# Patient Record
Sex: Female | Born: 1970 | Race: White | Hispanic: No | Marital: Married | Smoking: Current every day smoker
Health system: Southern US, Community
[De-identification: ages and names within clinical notes are randomized; demographics above are authoritative.]

## PROBLEM LIST (undated history)

## (undated) DIAGNOSIS — M797 Fibromyalgia: Secondary | ICD-10-CM

## (undated) DIAGNOSIS — F419 Anxiety disorder, unspecified: Secondary | ICD-10-CM

## (undated) DIAGNOSIS — M1990 Unspecified osteoarthritis, unspecified site: Secondary | ICD-10-CM

## (undated) HISTORY — PX: KNEE SURGERY: SHX244

## (undated) HISTORY — PX: ABLATION: SHX5711

## (undated) HISTORY — PX: TUBAL LIGATION: SHX77

## (undated) HISTORY — PX: CHOLECYSTECTOMY: SHX55

---

## 2015-07-12 ENCOUNTER — Ambulatory Visit
Admission: EM | Admit: 2015-07-12 | Discharge: 2015-07-12 | Disposition: A | Discharge status: Home / Self Care | Payer: BLUE CROSS/BLUE SHIELD | Attending: Family Medicine | Admitting: Family Medicine

## 2015-07-12 ENCOUNTER — Encounter: Payer: Self-pay | Admitting: Emergency Medicine

## 2015-07-12 DIAGNOSIS — S61215A Laceration without foreign body of left ring finger without damage to nail, initial encounter: Secondary | ICD-10-CM | POA: Diagnosis not present

## 2015-07-12 DIAGNOSIS — S61213A Laceration without foreign body of left middle finger without damage to nail, initial encounter: Secondary | ICD-10-CM

## 2015-07-12 HISTORY — DX: Unspecified osteoarthritis, unspecified site: M19.90

## 2015-07-12 HISTORY — DX: Fibromyalgia: M79.7

## 2015-07-12 HISTORY — DX: Anxiety disorder, unspecified: F41.9

## 2015-07-12 MED ORDER — BACITRACIN ZINC 500 UNIT/GM EX OINT
TOPICAL_OINTMENT | Freq: Once | CUTANEOUS | Status: AC
  Administered 2015-07-12: 18:00:00 via TOPICAL

## 2015-07-12 MED ORDER — LIDOCAINE HCL (PF) 1 % IJ SOLN: 5.0000 mL | Freq: Once | INTRAMUSCULAR | Status: DC

## 2015-07-12 MED ORDER — TETANUS-DIPHTH-ACELL PERTUSSIS 5-2.5-18.5 LF-MCG/0.5 IM SUSP
0.5000 mL | Freq: Once | INTRAMUSCULAR | Status: AC
  Administered 2015-07-12: 0.5 mL via INTRAMUSCULAR

## 2015-07-12 NOTE — Discharge Instructions (Signed)
Keep clean and dry. Wear splint as discussed. Return in 7- 10 days for suture removal. Return sooner for redness, drainage, swelling or wound concerns.   Follow up with your primary care physician this week as needed. Return to Urgent care for new or worsening concerns.    Laceration Care, Adult A laceration is a cut that goes through all layers of the skin. The cut also goes into the tissue that is right under the skin. Some cuts heal on their own. Others need to be closed with stitches (sutures), staples, skin adhesive strips, or wound glue. Taking care of your cut lowers your risk of infection and helps your cut to heal better. HOW TO TAKE CARE OF YOUR CUT For stitches or staples:  Keep the wound clean and dry.  If you were given a bandage (dressing), you should change it at least one time per day or as told by your doctor. You should also change it if it gets wet or dirty.  Keep the wound completely dry for the first 24 hours or as told by your doctor. After that time, you may take a shower or a bath. However, make sure that the wound is not soaked in water until after the stitches or staples have been removed.  Clean the wound one time each day or as told by your doctor:  Wash the wound with soap and water.  Rinse the wound with water until all of the soap comes off.  Pat the wound dry with a clean towel. Do not rub the wound.  After you clean the wound, put a thin layer of antibiotic ointment on it as told by your doctor. This ointment:  Helps to prevent infection.  Keeps the bandage from sticking to the wound.  Have your stitches or staples removed as told by your doctor. If your doctor used skin adhesive strips:   Keep the wound clean and dry.  If you were given a bandage, you should change it at least one time per day or as told by your doctor. You should also change it if it gets dirty or wet.  Do not get the skin adhesive strips wet. You can take a shower or a bath,  but be careful to keep the wound dry.  If the wound gets wet, pat it dry with a clean towel. Do not rub the wound.  Skin adhesive strips fall off on their own. You can trim the strips as the wound heals. Do not remove any strips that are still stuck to the wound. They will fall off after a while. If your doctor used wound glue:  Try to keep your wound dry, but you may briefly wet it in the shower or bath. Do not soak the wound in water, such as by swimming.  After you take a shower or a bath, gently pat the wound dry with a clean towel. Do not rub the wound.  Do not do any activities that will make you really sweaty until the skin glue has fallen off on its own.  Do not apply liquid, cream, or ointment medicine to your wound while the skin glue is still on.  If you were given a bandage, you should change it at least one time per day or as told by your doctor. You should also change it if it gets dirty or wet.  If a bandage is placed over the wound, do not let the tape for the bandage touch the skin glue.  Do not pick at the glue. The skin glue usually stays on for 5-10 days. Then, it falls off of the skin. General Instructions  To help prevent scarring, make sure to cover your wound with sunscreen whenever you are outside after stitches are removed, after adhesive strips are removed, or when wound glue stays in place and the wound is healed. Make sure to wear a sunscreen of at least 30 SPF.  Take over-the-counter and prescription medicines only as told by your doctor.  If you were given antibiotic medicine or ointment, take or apply it as told by your doctor. Do not stop using the antibiotic even if your wound is getting better.  Do not scratch or pick at the wound.  Keep all follow-up visits as told by your doctor. This is important.  Check your wound every day for signs of infection. Watch for:  Redness, swelling, or pain.  Fluid, blood, or pus.  Raise (elevate) the injured  area above the level of your heart while you are sitting or lying down, if possible. GET HELP IF:  You got a tetanus shot and you have any of these problems at the injection site:  Swelling.  Very bad pain.  Redness.  Bleeding.  You have a fever.  A wound that was closed breaks open.  You notice a bad smell coming from your wound or your bandage.  You notice something coming out of the wound, such as wood or glass.  Medicine does not help your pain.  You have more redness, swelling, or pain at the site of your wound.  You have fluid, blood, or pus coming from your wound.  You notice a change in the color of your skin near your wound.  You need to change the bandage often because fluid, blood, or pus is coming from the wound.  You start to have a new rash.  You start to have numbness around the wound. GET HELP RIGHT AWAY IF:  You have very bad swelling around the wound.  Your pain suddenly gets worse and is very bad.  You notice painful lumps near the wound or on skin that is anywhere on your body.  You have a red streak going away from your wound.  The wound is on your hand or foot and you cannot move a finger or toe like you usually can.  The wound is on your hand or foot and you notice that your fingers or toes look pale or bluish.   This information is not intended to replace advice given to you by your health care provider. Make sure you discuss any questions you have with your health care provider.   Document Released: 07/21/2007 Document Revised: 06/18/2014 Document Reviewed: 01/28/2014 Elsevier Interactive Patient Education [employer]

## 2015-07-12 NOTE — ED Notes (Signed)
Patient c/o laceration on left middle and left  ring finger while rubbing her hand across her couch. Per patient injury hand/ finger by a nail in the material of her couch x today.

## 2015-07-12 NOTE — ED Provider Notes (Addendum)
[facility]  ____________________________________________  Time seen: Approximately 1700 PM  I have reviewed the triage vital signs and the nursing notes.   HISTORY  Chief Complaint Laceration   HPI Sandra Alexander is a 45 y.o. female presents with a complaint of laceration to left hand. Patient reports that just prior to arrival she accidentally cut her left hand on a nail sticking out of a couch. Patient reports that she recently got this couch and is restoring it. Patient states that she was sliding her hand across the top of the couch and accidentally slit her hand over top of the nail slightly causing a laceration. Denies any blunt crush injury or blunt trauma. States minimal pain directly at laceration site only. Reports immediately irrigated at home. Unsure of last tetanus immunization. Reports right hand dominant. Denies any numbness or tingling sensation. Denies any pain radiation. Denies any other pain, injury or discomfort. Denies fevers or recent sickness.   Past Medical History  Diagnosis Date  . Fibromyalgia   . Arthritis   . Anxiety     There are no active problems to display for this patient.   Past Surgical History  Procedure Laterality Date  . Cholecystectomy    . Tubal ligation    . Ablation    . Knee surgery Right     Current Outpatient Rx  Name  Route  Sig  Dispense  Refill  . busPIRone (BUSPAR) 7.5 MG tablet   Oral   Take 7.5 mg by mouth 3 (three) times daily.         . citalopram (CELEXA) 40 MG tablet   Oral   Take 40 mg by mouth daily.         Marland Kitchen. DICLOFENAC PO   Oral   Take by mouth.         . fluticasone (FLONASE) 50 MCG/ACT nasal spray   Each Nare   Place 2 sprays into both nostrils daily.         Marland Kitchen. gabapentin (NEURONTIN) 300 MG capsule   Oral   Take 300 mg by mouth 4 (four) times daily.         . pramipexole (MIRAPEX) 0.125 MG tablet   Oral   Take 0.125 mg by mouth as directed.           Allergies Doxycycline  and Sulfa antibiotics  History reviewed. No pertinent family history.  Social History Social History  Substance Use Topics  . Smoking status: Current Every Day Smoker -- 0.50 packs/day  . Smokeless tobacco: None  . Alcohol Use: Yes    Review of Systems Constitutional: No fever/chills Eyes: No visual changes. ENT: No sore throat. Cardiovascular: Denies chest pain. Respiratory: Denies shortness of breath. Gastrointestinal: No abdominal pain.  No nausea, no vomiting.  No diarrhea.  No constipation. Genitourinary: Negative for dysuria. Musculoskeletal: Negative for back pain. Skin: Negative for rash.As above.  Neurological: Negative for headaches, focal weakness or numbness.  10-point ROS otherwise negative.  ____________________________________________   PHYSICAL EXAM:  VITAL SIGNS: ED Triage Vitals  Enc Vitals Group     BP 07/12/15 1624 122/78 mmHg     Pulse Rate 07/12/15 1624 72     Resp -- 18     Temp 07/12/15 1624 98.4 F (36.9 C)     Temp Source 07/12/15 1624 Oral     SpO2 07/12/15 1624 99 %     Weight 07/12/15 1624 140 lb (63.504 kg)     Height 07/12/15 1624 5' (  1.524 m)     Head Cir --      Peak Flow --      Pain Score 07/12/15 1624 2     Pain Loc --      Pain Edu? --      Excl. in GC? --     Constitutional: Alert and oriented. Well appearing and in no acute distress. Eyes: Conjunctivae are normal. PERRL. EOMI. Head: Atraumatic. Neck: No stridor.  No cervical spine tenderness to palpation. Cardiovascular: Normal rate, regular rhythm. Grossly normal heart sounds.  Good peripheral circulation. Respiratory: Normal respiratory effort.  No retractions. Lungs CTAB. Musculoskeletal: No lower or upper extremity tenderness nor edema.  Bilateral hand grip strong and equal. Bilateral distal radial pulses equal and easily palpated. Sensation intact to left hand. No motor or tendon deficits to left hand. Neurologic:  Normal speech and language. No gross focal  neurologic deficits are appreciated. No gait instability. Skin:  Skin is warm, dry and intact. No rash noted. Except: Left hand volar surface at base of third digit across partially flexor crease into the third for first phalanx 2 cm laceration present,wound base visible, minimal bleeding, no foreign bodies visible, minimal tenderness to palpation, no exudate drainage, no erythema, left third digit with full range of motion without tendon or motor deficit. Except: Left fourth digit proximal phalanx volar surface to 1 cm flap laceration present, minimal tenderness to palpation, wound base visible, no foreign bodies visible, no exudate or drainage,no erythema, minimal bleeding, left fourth digit with full range of motion without tendon or motor deficit. Psychiatric: Mood and affect are normal. Speech and behavior are normal.  ____________________________________________   LABS (all labs ordered are listed, but only abnormal results are displayed)  Labs Reviewed - No data to display  RADIOLOGY  No results found. ____________________________________________   PROCEDURES  Procedure(s) performed:  Procedure(s) performed:  Procedure explained and verbal consent obtained. Consent: Verbal consent obtained. Written consent not obtained. Risks and benefits: risks, benefits and alternatives were discussed Patient identity confirmed: verbally with patient and hospital-assigned identification number  Consent given by: patient   Laceration Repair Location: Left third finger Length: 2cm Foreign bodies: no foreign bodies Tendon involvement: none Nerve involvement: none Preparation: Patient was prepped and draped in the usual sterile fashion. Anesthesia with 1% Lidocaine Irrigation solution: saline and Betadine  Irrigation method: jet lavage Amount of cleaning: copious Repaired with 5-0 nylon Number of sutures: 2 Technique: simple interrupted  Approximation: loose Patient tolerate  well. Wound well approximated post repair.  Antibiotic ointment and dressing applied.  Wound care instructions provided.  Observe for any signs of infection or other problems.     Laceration Repair Location: Left fourth finger Length: 2cm Foreign bodies: no foreign bodies Tendon involvement: none Nerve involvement: none Preparation: Patient was prepped and draped in the usual sterile fashion. Anesthesia with 1% Lidocaine Irrigation solution: saline and Betadine  Irrigation method: jet lavage Amount of cleaning: copious Repaired with 5-0 nylon Number of sutures: 2 Technique: simple interrupted  Approximation: loose Patient tolerate well. Wound well approximated post repair.  Antibiotic ointment and dressing applied.  Wound care instructions provided.  Observe for any signs of infection or other problems.      Splint Finger splint applied to left third digit and third and fourth digits buddy taped. Neurovascular intact post application.  _____________________   INITIAL IMPRESSION / ASSESSMENT AND PLAN / ED COURSE  Pertinent labs & imaging results that were available during my care  of the patient were reviewed by me and considered in my medical decision making (see chart for details).  Very well-appearing patient. No acute distress. Presents for the complaints of left third and fourth finger lacerations post injury just prior to arrival. Left hand for range of motion. Left hand no motor or tendon deficits. 2 lacerations repaired with 2 sutures each. Wound well approximated. Patient tolerated well. Wound is minimal tenderness palpation prior to local anesthesia, post local anesthesia nontender and no bony tenderness. Discussed wound monitoring and wound cleaning. Topical over-the-counter antibiotic daily. Clean daily with soap and water. Finger splint applied to left third digit and third and fourth digits buddy taped for protecting of third digit suture integrity due to location,  and directed to use for 3 days. Return to urgent care or PCP in 7-10 days for suture removal. Discussed return sooner for any concerns of wound changes. Tetanus immunization updated.    Discussed follow up with Primary care physician this week. Discussed follow up and return parameters including no resolution or any worsening concerns. Patient verbalized understanding and agreed to plan.    ____________________________________________   FINAL CLINICAL IMPRESSION(S) / ED DIAGNOSES  Final diagnoses:  Laceration of third finger, left, initial encounter  Laceration of fourth finger, left, initial encounter     Discharge Medication List as of 07/12/2015  5:34 PM      Note: This dictation was prepared with Dragon dictation along with smaller phrase technology. Any transcriptional errors that result from this process are unintentional.       Renford Dills, NP 07/12/15 1841  Renford Dills, NP 07/12/15 1842

## 2017-09-26 DIAGNOSIS — M25562 Pain in left knee: Secondary | ICD-10-CM | POA: Diagnosis not present

## 2017-09-26 DIAGNOSIS — M1990 Unspecified osteoarthritis, unspecified site: Secondary | ICD-10-CM | POA: Diagnosis not present

## 2017-09-26 DIAGNOSIS — M5030 Other cervical disc degeneration, unspecified cervical region: Secondary | ICD-10-CM | POA: Diagnosis not present

## 2017-09-26 DIAGNOSIS — M797 Fibromyalgia: Secondary | ICD-10-CM | POA: Diagnosis not present

## 2017-11-02 ENCOUNTER — Emergency Department
Admission: EM | Admit: 2017-11-02 | Discharge: 2017-11-02 | Disposition: A | Discharge status: Home / Self Care | Payer: Medicaid Other | Attending: Emergency Medicine | Admitting: Emergency Medicine

## 2017-11-02 ENCOUNTER — Emergency Department: Payer: Medicaid Other

## 2017-11-02 ENCOUNTER — Encounter: Payer: Self-pay | Admitting: Emergency Medicine

## 2017-11-02 DIAGNOSIS — Y939 Activity, unspecified: Secondary | ICD-10-CM | POA: Insufficient documentation

## 2017-11-02 DIAGNOSIS — F17210 Nicotine dependence, cigarettes, uncomplicated: Secondary | ICD-10-CM | POA: Diagnosis not present

## 2017-11-02 DIAGNOSIS — S60222A Contusion of left hand, initial encounter: Secondary | ICD-10-CM | POA: Diagnosis not present

## 2017-11-02 DIAGNOSIS — W1830XA Fall on same level, unspecified, initial encounter: Secondary | ICD-10-CM | POA: Insufficient documentation

## 2017-11-02 DIAGNOSIS — Y929 Unspecified place or not applicable: Secondary | ICD-10-CM | POA: Diagnosis not present

## 2017-11-02 DIAGNOSIS — S6992XA Unspecified injury of left wrist, hand and finger(s), initial encounter: Secondary | ICD-10-CM | POA: Diagnosis not present

## 2017-11-02 DIAGNOSIS — M7989 Other specified soft tissue disorders: Secondary | ICD-10-CM | POA: Diagnosis not present

## 2017-11-02 DIAGNOSIS — Z79899 Other long term (current) drug therapy: Secondary | ICD-10-CM | POA: Diagnosis not present

## 2017-11-02 DIAGNOSIS — Y999 Unspecified external cause status: Secondary | ICD-10-CM | POA: Insufficient documentation

## 2017-11-02 DIAGNOSIS — M79642 Pain in left hand: Secondary | ICD-10-CM | POA: Diagnosis not present

## 2017-11-02 MED ORDER — HYDROCODONE-ACETAMINOPHEN 5-325 MG PO TABS: 1.0000 | ORAL_TABLET | Freq: Four times a day (QID) | ORAL | 0 refills | Status: DC | PRN

## 2017-11-02 MED ORDER — HYDROCODONE-ACETAMINOPHEN 5-325 MG PO TABS
1.0000 | ORAL_TABLET | Freq: Once | ORAL | Status: AC
Start: 2017-11-02 — End: 2017-11-02
  Administered 2017-11-02: 1 via ORAL
  Filled 2017-11-02: qty 1

## 2017-11-02 NOTE — Discharge Instructions (Addendum)
Follow-up with your primary care provider if any continued problems.  Continue to elevate and ice as needed for pain or swelling.  You may continue taking ibuprofen 3 times a day as needed for inflammation.  Norco is for moderate to severe pain.  Take 1 every 6 hours as needed for pain.  Do not drive or operate machinery while taking this medication as it could cause increased risk for injury.

## 2017-11-02 NOTE — ED Triage Notes (Signed)
Pt had mechanical fall that caused pt to fall on left hand. Pt states "it hasn't gotten better." No obvious deformity. PT able to move hand but reports pain with movement.

## 2017-11-02 NOTE — ED Provider Notes (Signed)
[facility] Emergency Department Provider Note  ___________________________________________   First MD Initiated Contact with Patient 11/02/17 1405     (approximate)  I have reviewed the triage vital signs and the nursing notes.   HISTORY  Chief Complaint Hand Injury   HPI Journi Moffa is a 47 y.o. female presents to the ED with complaint of left hand pain.  Patient states that she fell several days ago on the ulnar side of her left hand and is not felt better since her fall.  She has taken over-the-counter medication with some minimal relief of her pain.  She continues to be able to move her digits but is guarded secondary to discomfort.  Denies any previous injury to her hand.  She rates her pain as a 7 out of 10.   Past Medical History:  Diagnosis Date  . Anxiety   . Arthritis   . Fibromyalgia     There are no active problems to display for this patient.   Past Surgical History:  Procedure Laterality Date  . ABLATION    . CHOLECYSTECTOMY    . KNEE SURGERY Right   . TUBAL LIGATION      Prior to Admission medications   Medication Sig Start Date End Date Taking? Authorizing Provider  busPIRone (BUSPAR) 7.5 MG tablet Take 7.5 mg by mouth 3 (three) times daily.    [provider]  citalopram (CELEXA) 40 MG tablet Take 40 mg by mouth daily.    [provider]  DICLOFENAC PO Take by mouth.    [provider]  fluticasone (FLONASE) 50 MCG/ACT nasal spray Place 2 sprays into both nostrils daily.    [provider]  gabapentin (NEURONTIN) 300 MG capsule Take 300 mg by mouth 4 (four) times daily.    [provider]  HYDROcodone-acetaminophen (NORCO/VICODIN) 5-325 MG tablet Take 1 tablet by mouth every 6 (six) hours as needed for moderate pain. 11/02/17   Tommi Rumps, PA-C  pramipexole (MIRAPEX) 0.125 MG tablet Take 0.125 mg by mouth as directed.    [provider]    Allergies Doxycycline  and Sulfa antibiotics  No family history on file.  Social History Social History   Tobacco Use  . Smoking status: Current Every Day Smoker    Packs/day: 0.50  Substance Use Topics  . Alcohol use: Yes  . Drug use: No    Review of Systems Constitutional: No fever/chills Cardiovascular: Denies chest pain. Respiratory: Denies shortness of breath. Musculoskeletal: Positive for left hand pain. Skin: Negative for injury. Neurological: Negative for headaches, focal weakness or numbness. ___________________________________________   PHYSICAL EXAM:  VITAL SIGNS: ED Triage Vitals [11/02/17 1325]  Enc Vitals Group     BP (!) 146/83     Pulse Rate 71     Resp 16     Temp 97.6 F (36.4 C)     Temp Source Oral     SpO2 100 %     Weight 145 lb (65.8 kg)     Height 5' (1.524 m)     Head Circumference      Peak Flow      Pain Score 7     Pain Loc      Pain Edu?      Excl. in GC?    Constitutional: Alert and oriented. Well appearing and in no acute distress. Eyes: Conjunctivae are normal.  Head: Atraumatic. Neck: No stridor.   Cardiovascular: Normal rate, regular rhythm. Grossly normal heart sounds.  Good peripheral circulation. Respiratory: Normal respiratory effort.  No retractions. Lungs CTAB. Musculoskeletal: Examination of left hand there is no gross deformity and no appreciated soft tissue edema present.  No ecchymosis or abrasions are seen.  Patient is able to flex and extend all digits but slowly secondary to discomfort.  Capillary refill is less than 3 seconds and skin is intact.  Area is tender over the fourth and fifth metacarpal without any evidence of deformity. Neurologic:  Normal speech and language. No gross focal neurologic deficits are appreciated.  Skin:  Skin is warm, dry and intact. No rash noted. Psychiatric: Mood and affect are normal. Speech and behavior are normal.  ____________________________________________   LABS (all labs ordered are listed, but  only abnormal results are displayed)  Labs Reviewed - No data to display   RADIOLOGY  ED MD interpretation:  Left hand x-ray is negative for acute fracture.  Official radiology report(s): Dg Hand Complete Left  Result Date: 11/02/2017 CLINICAL DATA:  Hand pain and swelling since falling 1 week ago. Pain in the small finger extending into the wrist. EXAM: LEFT HAND - COMPLETE 3+ VIEW COMPARISON:  None. FINDINGS: The mineralization and alignment are normal. There is no evidence of acute fracture or dislocation. Minimal joint space narrowing in the 2nd DIP joint. The additional joint spaces appear maintained. No focal soft tissue abnormalities are seen. IMPRESSION: No acute osseous findings. Electronically Signed   By: Carey BullocksWilliam  Veazey M.D.   On: 11/02/2017 14:11  ____________________________________________   PROCEDURES  Procedure(s) performed: None  Procedures  Critical Care performed: No  ____________________________________________   INITIAL IMPRESSION / ASSESSMENT AND PLAN / ED COURSE  As part of my medical decision making, I reviewed the following data within the electronic MEDICAL RECORD NUMBER Notes from prior ED visits and [place] Controlled Substance Database  Patient presents to the ED after a fall and injury to her left hand.  She states this happened several days ago and is not gotten any better.  She has been taking over-the-counter medication without complete relief.  Physical exam shows tenderness to the fourth and fifth meta carpals.  No gross deformity is noted.  X-rays were reassuring that there was no acute injury.  Patient was given Norco while in the department and also a prescription to be taken 1 every 6 hours as needed.  She is encouraged to continue taking ibuprofen for inflammation.  She is to follow-up with her PCP.  She is encouraged to ice and elevate her hand as needed for pain or swelling.  ____________________________________________   FINAL CLINICAL  IMPRESSION(S) / ED DIAGNOSES  Final diagnoses:  Contusion of left hand, initial encounter     ED Discharge Orders         Ordered    HYDROcodone-acetaminophen (NORCO/VICODIN) 5-325 MG tablet  Every 6 hours PRN     11/02/17 1428           Note:  This document was prepared using Dragon voice recognition software and may include unintentional dictation errors.    Tommi RumpsSummers, Abbigal Radich L, PA-C 11/02/17 1450    Rockne MenghiniNorman, Anne-Caroline, MD 11/03/17 2229

## 2017-12-23 DIAGNOSIS — M545 Low back pain: Secondary | ICD-10-CM | POA: Diagnosis not present

## 2017-12-23 DIAGNOSIS — M542 Cervicalgia: Secondary | ICD-10-CM | POA: Diagnosis not present

## 2017-12-23 DIAGNOSIS — M549 Dorsalgia, unspecified: Secondary | ICD-10-CM | POA: Diagnosis not present

## 2017-12-23 DIAGNOSIS — G8929 Other chronic pain: Secondary | ICD-10-CM | POA: Diagnosis not present

## 2017-12-30 DIAGNOSIS — M47812 Spondylosis without myelopathy or radiculopathy, cervical region: Secondary | ICD-10-CM | POA: Diagnosis not present

## 2017-12-30 DIAGNOSIS — M5416 Radiculopathy, lumbar region: Secondary | ICD-10-CM | POA: Diagnosis not present

## 2017-12-30 DIAGNOSIS — M47816 Spondylosis without myelopathy or radiculopathy, lumbar region: Secondary | ICD-10-CM | POA: Diagnosis not present

## 2017-12-30 DIAGNOSIS — M5440 Lumbago with sciatica, unspecified side: Secondary | ICD-10-CM | POA: Diagnosis not present

## 2017-12-30 DIAGNOSIS — M7918 Myalgia, other site: Secondary | ICD-10-CM | POA: Diagnosis not present

## 2018-01-06 ENCOUNTER — Other Ambulatory Visit: Payer: Self-pay

## 2018-01-06 ENCOUNTER — Encounter: Payer: Self-pay | Admitting: Emergency Medicine

## 2018-01-06 ENCOUNTER — Ambulatory Visit
Admission: EM | Admit: 2018-01-06 | Discharge: 2018-01-06 | Disposition: A | Discharge status: Home / Self Care | Payer: Medicaid Other | Attending: Family Medicine | Admitting: Family Medicine

## 2018-01-06 DIAGNOSIS — H6502 Acute serous otitis media, left ear: Secondary | ICD-10-CM | POA: Diagnosis not present

## 2018-01-06 DIAGNOSIS — J0100 Acute maxillary sinusitis, unspecified: Secondary | ICD-10-CM | POA: Diagnosis not present

## 2018-01-06 MED ORDER — AMOXICILLIN 875 MG PO TABS: 875.0000 mg | ORAL_TABLET | Freq: Two times a day (BID) | ORAL | 0 refills | Status: DC

## 2018-01-06 MED ORDER — FLUTICASONE PROPIONATE 50 MCG/ACT NA SUSP: 2.0000 | Freq: Every day | NASAL | 0 refills | Status: AC

## 2018-01-06 NOTE — ED Triage Notes (Signed)
Patient c/o sinus congestion and pressure for 2 weeks.  Patient denies fevers.

## 2018-01-06 NOTE — ED Provider Notes (Signed)
[facility]    CSN: [id number] Arrival date & time: 01/06/18  1040     History   Chief Complaint Chief Complaint  Patient presents with  . Sinus Problem    APPOINTMENT    HPI Sandra Alexander is a 47 y.o. female.   The history is provided by the patient.  Sinus Problem   URI  Presenting symptoms: congestion, cough, ear pain, facial pain and rhinorrhea   Severity:  Moderate Onset quality:  Sudden Duration:  2 weeks Timing:  Constant Progression:  Worsening Chronicity:  New Relieved by:  Nothing Ineffective treatments:  OTC medications Associated symptoms: sinus pain   Risk factors: sick contacts   Risk factors: not elderly, no chronic cardiac disease, no chronic kidney disease, no chronic respiratory disease, no diabetes mellitus, no immunosuppression, no recent illness and no recent travel     Past Medical History:  Diagnosis Date  . Anxiety   . Arthritis   . Fibromyalgia     There are no active problems to display for this patient.   Past Surgical History:  Procedure Laterality Date  . ABLATION    . CHOLECYSTECTOMY    . KNEE SURGERY Right   . TUBAL LIGATION      OB History   None      Home Medications    Prior to Admission medications   Medication Sig Start Date End Date Taking? Authorizing Provider  etodolac (LODINE) 500 MG tablet Take by mouth. 12/23/17 12/23/18 Yes [provider]  gabapentin (NEURONTIN) 300 MG capsule Take 300 mg by mouth 4 (four) times daily.   Yes [provider]  methocarbamol (ROBAXIN) 500 MG tablet Take by mouth. 12/30/17  Yes [provider]  amoxicillin (AMOXIL) 875 MG tablet Take 1 tablet (875 mg total) by mouth 2 (two) times daily. 01/06/18   Sandra Alexander, Sandra Denomme, MD  busPIRone (BUSPAR) 7.5 MG tablet Take 7.5 mg by mouth 3 (three) times daily.    [provider]  citalopram (CELEXA) 40 MG tablet Take 40 mg by mouth daily.    [provider]  DICLOFENAC PO Take by mouth.     [provider]  fluticasone (FLONASE) 50 MCG/ACT nasal spray Place 2 sprays into both nostrils daily. 01/06/18   Sandra Alexander, Marionette Meskill, MD  HYDROcodone-acetaminophen (NORCO/VICODIN) 5-325 MG tablet Take 1 tablet by mouth every 6 (six) hours as needed for moderate pain. 11/02/17   Sandra Alexander, Rhonda L, PA-C  pramipexole (MIRAPEX) 0.125 MG tablet Take 0.125 mg by mouth as directed.    [provider]    Family History Family History  Problem Relation Age of Onset  . Hypertension Mother   . Hyperlipidemia Mother   . Epilepsy Father     Social History Social History   Tobacco Use  . Smoking status: Current Every Day Smoker    Packs/day: 0.50  . Smokeless tobacco: Never Used  Substance Use Topics  . Alcohol use: Yes  . Drug use: No     Allergies   Doxycycline and Sulfa antibiotics   Review of Systems Review of Systems  HENT: Positive for congestion, ear pain, rhinorrhea and sinus pain.   Respiratory: Positive for cough.      Physical Exam Triage Vital Signs ED Triage Vitals  Enc Vitals Group     BP 01/06/18 1118 98/74     Pulse Rate 01/06/18 1118 78     Resp 01/06/18 1118 14     Temp 01/06/18 1118 97.9 F (36.6 C)  Temp Source 01/06/18 1118 Oral     SpO2 01/06/18 1118 99 %     Weight 01/06/18 1113 145 lb (65.8 kg)     Height 01/06/18 1113 5' (1.524 m)     Head Circumference --      Peak Flow --      Pain Score 01/06/18 1113 4     Pain Loc --      Pain Edu? --      Excl. in GC? --    No data found.  Updated Vital Signs BP 98/74 (BP Location: Left Arm)   Pulse 78   Temp 97.9 F (36.6 C) (Oral)   Resp 14   Ht 5' (1.524 m)   Wt 65.8 kg   LMP 06/04/2015   SpO2 99%   BMI 28.32 kg/m   Visual Acuity Right Eye Distance:   Left Eye Distance:   Bilateral Distance:    Right Eye Near:   Left Eye Near:    Bilateral Near:     Physical Exam  Constitutional: She appears well-developed and well-nourished. No distress.  HENT:  Head:  Normocephalic and atraumatic.  Right Ear: Tympanic membrane, external ear and ear canal normal.  Left Ear: External ear and ear canal normal. Tympanic membrane is erythematous and bulging. A middle ear effusion is present.  Nose: Mucosal edema and rhinorrhea present. No nose lacerations, sinus tenderness, nasal deformity, septal deviation or nasal septal hematoma. No epistaxis.  No foreign bodies. Right sinus exhibits maxillary sinus tenderness and frontal sinus tenderness. Left sinus exhibits maxillary sinus tenderness and frontal sinus tenderness.  Mouth/Throat: Uvula is midline, oropharynx is clear and moist and mucous membranes are normal. No oropharyngeal exudate.  Eyes: Pupils are equal, round, and reactive to light. Conjunctivae and EOM are normal. Right eye exhibits no discharge. Left eye exhibits no discharge. No scleral icterus.  Neck: Normal range of motion. Neck supple. No thyromegaly present.  Cardiovascular: Normal rate, regular rhythm and normal heart sounds.  Pulmonary/Chest: Effort normal and breath sounds normal. No respiratory distress. She has no wheezes. She has no rales.  Lymphadenopathy:    She has no cervical adenopathy.  Skin: She is not diaphoretic.  Nursing note and vitals reviewed.    UC Treatments / Results  Labs (all labs ordered are listed, but only abnormal results are displayed) Labs Reviewed - No data to display  EKG None  Radiology No results found.  Procedures Procedures (including critical care time)  Medications Ordered in UC Medications - No data to display  Initial Impression / Assessment and Plan / UC Course  I have reviewed the triage vital signs and the nursing notes.  Pertinent labs & imaging results that were available during my care of the patient were reviewed by me and considered in my medical decision making (see chart for details).      Final Clinical Impressions(s) / UC Diagnoses   Final diagnoses:  Acute serous otitis  media of left ear, recurrence not specified  Acute maxillary sinusitis, recurrence not specified    ED Prescriptions    Medication Sig Dispense Auth. Provider   amoxicillin (AMOXIL) 875 MG tablet Take 1 tablet (875 mg total) by mouth 2 (two) times daily. 20 tablet Sandra Mccallum, MD   fluticasone (FLONASE) 50 MCG/ACT nasal spray Place 2 sprays into both nostrils daily. 16 g Sandra Mccallum, MD     1. diagnosis reviewed with patient 2. rx as per orders above; reviewed possible side effects, interactions, risks and benefits  3. Follow-up prn if symptoms worsen or don't improve   Controlled Substance Prescriptions [place] Controlled Substance Registry consulted? Not Applicable   Sandra Mccallum, MD 01/06/18 1152

## 2018-01-09 DIAGNOSIS — M4727 Other spondylosis with radiculopathy, lumbosacral region: Secondary | ICD-10-CM | POA: Diagnosis not present

## 2018-01-09 DIAGNOSIS — M5440 Lumbago with sciatica, unspecified side: Secondary | ICD-10-CM | POA: Diagnosis not present

## 2018-03-09 DIAGNOSIS — M47816 Spondylosis without myelopathy or radiculopathy, lumbar region: Secondary | ICD-10-CM | POA: Diagnosis not present

## 2018-03-22 DIAGNOSIS — M47816 Spondylosis without myelopathy or radiculopathy, lumbar region: Secondary | ICD-10-CM | POA: Diagnosis not present

## 2018-04-12 DIAGNOSIS — M26621 Arthralgia of right temporomandibular joint: Secondary | ICD-10-CM | POA: Diagnosis not present

## 2018-04-12 DIAGNOSIS — R0981 Nasal congestion: Secondary | ICD-10-CM | POA: Diagnosis not present

## 2018-04-12 DIAGNOSIS — S46019A Strain of muscle(s) and tendon(s) of the rotator cuff of unspecified shoulder, initial encounter: Secondary | ICD-10-CM | POA: Diagnosis not present

## 2018-05-10 DIAGNOSIS — B9689 Other specified bacterial agents as the cause of diseases classified elsewhere: Secondary | ICD-10-CM | POA: Diagnosis not present

## 2018-05-10 DIAGNOSIS — J019 Acute sinusitis, unspecified: Secondary | ICD-10-CM | POA: Diagnosis not present

## 2018-06-28 DIAGNOSIS — M47816 Spondylosis without myelopathy or radiculopathy, lumbar region: Secondary | ICD-10-CM | POA: Diagnosis not present

## 2018-07-07 DIAGNOSIS — M797 Fibromyalgia: Secondary | ICD-10-CM | POA: Diagnosis not present

## 2018-07-07 DIAGNOSIS — G2581 Restless legs syndrome: Secondary | ICD-10-CM | POA: Diagnosis not present

## 2018-07-07 DIAGNOSIS — Z131 Encounter for screening for diabetes mellitus: Secondary | ICD-10-CM | POA: Diagnosis not present

## 2018-07-07 DIAGNOSIS — M25551 Pain in right hip: Secondary | ICD-10-CM | POA: Diagnosis not present

## 2018-07-07 DIAGNOSIS — Z13220 Encounter for screening for lipoid disorders: Secondary | ICD-10-CM | POA: Diagnosis not present

## 2018-07-07 DIAGNOSIS — M62838 Other muscle spasm: Secondary | ICD-10-CM | POA: Diagnosis not present

## 2018-07-07 DIAGNOSIS — R253 Fasciculation: Secondary | ICD-10-CM | POA: Diagnosis not present

## 2018-07-14 DIAGNOSIS — M76891 Other specified enthesopathies of right lower limb, excluding foot: Secondary | ICD-10-CM | POA: Diagnosis not present

## 2018-07-14 DIAGNOSIS — M1611 Unilateral primary osteoarthritis, right hip: Secondary | ICD-10-CM | POA: Diagnosis not present

## 2018-07-17 DIAGNOSIS — G2581 Restless legs syndrome: Secondary | ICD-10-CM | POA: Diagnosis not present

## 2018-07-18 DIAGNOSIS — G2581 Restless legs syndrome: Secondary | ICD-10-CM | POA: Diagnosis not present

## 2018-07-18 DIAGNOSIS — R252 Cramp and spasm: Secondary | ICD-10-CM | POA: Diagnosis not present

## 2018-07-18 DIAGNOSIS — R253 Fasciculation: Secondary | ICD-10-CM | POA: Diagnosis not present

## 2018-07-18 DIAGNOSIS — M6281 Muscle weakness (generalized): Secondary | ICD-10-CM | POA: Diagnosis not present

## 2018-08-09 DIAGNOSIS — R270 Ataxia, unspecified: Secondary | ICD-10-CM | POA: Diagnosis not present

## 2018-08-09 DIAGNOSIS — R29898 Other symptoms and signs involving the musculoskeletal system: Secondary | ICD-10-CM | POA: Diagnosis not present

## 2018-08-28 DIAGNOSIS — M47812 Spondylosis without myelopathy or radiculopathy, cervical region: Secondary | ICD-10-CM | POA: Diagnosis not present

## 2018-08-28 DIAGNOSIS — R278 Other lack of coordination: Secondary | ICD-10-CM | POA: Diagnosis not present

## 2018-08-28 DIAGNOSIS — M50322 Other cervical disc degeneration at C5-C6 level: Secondary | ICD-10-CM | POA: Diagnosis not present

## 2018-08-28 DIAGNOSIS — R29898 Other symptoms and signs involving the musculoskeletal system: Secondary | ICD-10-CM | POA: Diagnosis not present

## 2018-08-28 DIAGNOSIS — R270 Ataxia, unspecified: Secondary | ICD-10-CM | POA: Diagnosis not present

## 2018-09-06 DIAGNOSIS — H5213 Myopia, bilateral: Secondary | ICD-10-CM | POA: Diagnosis not present

## 2018-09-06 DIAGNOSIS — G35 Multiple sclerosis: Secondary | ICD-10-CM | POA: Diagnosis not present

## 2018-09-26 ENCOUNTER — Other Ambulatory Visit: Payer: Self-pay

## 2018-09-26 ENCOUNTER — Ambulatory Visit: Payer: Medicaid Other

## 2018-09-26 ENCOUNTER — Ambulatory Visit
Admission: EM | Admit: 2018-09-26 | Discharge: 2018-09-26 | Disposition: A | Discharge status: Home / Self Care | Payer: Medicaid Other | Attending: Family Medicine | Admitting: Family Medicine

## 2018-09-26 ENCOUNTER — Encounter: Payer: Self-pay | Admitting: Emergency Medicine

## 2018-09-26 DIAGNOSIS — S6991XA Unspecified injury of right wrist, hand and finger(s), initial encounter: Secondary | ICD-10-CM | POA: Diagnosis not present

## 2018-09-26 DIAGNOSIS — W2209XA Striking against other stationary object, initial encounter: Secondary | ICD-10-CM

## 2018-09-26 DIAGNOSIS — M25531 Pain in right wrist: Secondary | ICD-10-CM | POA: Diagnosis not present

## 2018-09-26 DIAGNOSIS — M79641 Pain in right hand: Secondary | ICD-10-CM | POA: Diagnosis not present

## 2018-09-26 NOTE — Discharge Instructions (Signed)
Xray negative.  Rest.  Ice.  Continue home gabapentin and mobic.  Take care  Dr. Lacinda Axon

## 2018-09-26 NOTE — ED Provider Notes (Signed)
[facility]    CSN: [id number] Arrival date & time: 09/26/18  [id number]   History   Chief Complaint Chief Complaint  Patient presents with  . Wrist Pain  . Hand Pain   HPI  48 year old female presents with the above complaints.  Patient reports that she hit her right hand on a gate yesterday afternoon.  Patient reports that she has developed right hand pain, swelling.  Also affects the wrist.  Pain is currently 4/10 in severity.  Exacerbated by activity.  No relieving factors.  No other reported symptoms.  No other complaints or concerns at this time.  PMH, Surgical Hx, Family Hx, Social History reviewed and updated as below.  PMH: Fibromyalgia    Arthritis    Anxiety    Depression    Tinnitus    Endometriosis determined by laparoscopy    Migraine headache     Past Surgical History:  Procedure Laterality Date  . ABLATION    . CHOLECYSTECTOMY    . KNEE SURGERY Right   . TUBAL LIGATION      OB History   No obstetric history on file.      Home Medications    Prior to Admission medications   Medication Sig Start Date End Date Taking? Authorizing Provider  fluticasone (FLONASE) 50 MCG/ACT nasal spray Place 2 sprays into both nostrils daily. 01/06/18  Yes [name], [place], MD  gabapentin (NEURONTIN) 300 MG capsule Take 300 mg by mouth 4 (four) times daily.   Yes [provider]  meloxicam (MOBIC) 15 MG tablet Take 15 mg by mouth daily.   Yes [provider]  pramipexole (MIRAPEX) 0.125 MG tablet Take 0.125 mg by mouth as directed.   Yes [provider]  citalopram (CELEXA) 40 MG tablet Take 40 mg by mouth daily.  09/26/18  [provider]    Family History Family History  Problem Relation Age of Onset  . Hypertension Mother   . Hyperlipidemia Mother   . Epilepsy Father     Social History Social History   Tobacco Use  . Smoking status: Current Every Day Smoker    Packs/day: 0.50  . Smokeless tobacco:  Never Used  Substance Use Topics  . Alcohol use: Yes  . Drug use: No     Allergies   Doxycycline and Sulfa antibiotics   Review of Systems Review of Systems  Constitutional: Negative.   Musculoskeletal:       Right hand and wrist pain.   Physical Exam Triage Vital Signs ED Triage Vitals  Enc Vitals Group     BP 09/26/18 0908 130/81     Pulse Rate 09/26/18 0908 74     Resp 09/26/18 0908 18     Temp 09/26/18 0908 98.1 F (36.7 C)     Temp Source 09/26/18 0908 Oral     SpO2 09/26/18 0908 100 %     Weight 09/26/18 0905 154 lb (69.9 kg)     Height 09/26/18 0905 5' (1.524 m)     Head Circumference --      Peak Flow --      Pain Score 09/26/18 0905 4     Pain Loc --      Pain Edu? --      Excl. in GC? --    Updated Vital Signs BP 130/81 (BP Location: Right Arm)   Pulse 74   Temp 98.1 F (36.7 C) (Oral)   Resp 18   Ht 5' (1.524 m)   Wt  69.9 kg   LMP 06/04/2015   SpO2 100%   BMI 30.08 kg/m   Visual Acuity Right Eye Distance:   Left Eye Distance:   Bilateral Distance:    Right Eye Near:   Left Eye Near:    Bilateral Near:     Physical Exam Constitutional:      General: She is not in acute distress.    Appearance: Normal appearance.  HENT:     Head: Normocephalic and atraumatic.  Eyes:     General:        Right eye: No discharge.        Left eye: No discharge.     Conjunctiva/sclera: Conjunctivae normal.  Cardiovascular:     Rate and Rhythm: Normal rate and regular rhythm.  Pulmonary:     Effort: Pulmonary effort is normal.     Breath sounds: Normal breath sounds. No wheezing or rales.  Musculoskeletal:     Comments: Right hand and wrist -mild tenderness on the ulnar aspect.  No apparent bruising.  Neurological:     Mental Status: She is alert.  Psychiatric:        Behavior: Behavior normal.     Comments: Flat affect.    UC Treatments / Results  Labs (all labs ordered are listed, but only abnormal results are displayed) Labs Reviewed - No  data to display  EKG   Radiology No results found.  Procedures Procedures (including critical care time)  Medications Ordered in UC Medications - No data to display  Initial Impression / Assessment and Plan / UC Course  I have reviewed the triage vital signs and the nursing notes.  Pertinent labs & imaging results that were available during my care of the patient were reviewed by me and considered in my medical decision making (see chart for details).    48 year old female presents with a hand injury.  X-rays reviewed personally.  X-rays negative.  Advised rest, ice.  Meloxicam as prescribed.  Supportive care.  Final Clinical Impressions(s) / UC Diagnoses   Final diagnoses:  Hand injury, right, initial encounter     Discharge Instructions     Xray negative.  Rest.  Ice.  Continue home gabapentin and mobic.  Take care  Dr. Lacinda Axon     ED Prescriptions    None     Controlled Substance Prescriptions [place] Controlled Substance Registry consulted? Not Applicable   Coral Spikes, [place] 09/26/18 5809

## 2018-09-26 NOTE — ED Triage Notes (Signed)
Patient states she hit her right hand on her gate yesterday afternoon. She is c/o right wrist, hand and arm pain and swelling.

## 2018-10-20 DIAGNOSIS — H524 Presbyopia: Secondary | ICD-10-CM | POA: Diagnosis not present

## 2018-11-02 DIAGNOSIS — M7918 Myalgia, other site: Secondary | ICD-10-CM | POA: Diagnosis not present

## 2018-11-14 DIAGNOSIS — R270 Ataxia, unspecified: Secondary | ICD-10-CM | POA: Diagnosis not present

## 2018-11-14 DIAGNOSIS — R253 Fasciculation: Secondary | ICD-10-CM | POA: Diagnosis not present

## 2019-01-05 DIAGNOSIS — M79672 Pain in left foot: Secondary | ICD-10-CM | POA: Diagnosis not present

## 2019-01-10 DIAGNOSIS — M79671 Pain in right foot: Secondary | ICD-10-CM | POA: Diagnosis not present

## 2019-01-10 DIAGNOSIS — M79672 Pain in left foot: Secondary | ICD-10-CM | POA: Diagnosis not present

## 2019-01-24 DIAGNOSIS — M7551 Bursitis of right shoulder: Secondary | ICD-10-CM | POA: Diagnosis not present

## 2019-01-24 DIAGNOSIS — M797 Fibromyalgia: Secondary | ICD-10-CM | POA: Diagnosis not present

## 2019-01-25 ENCOUNTER — Emergency Department
Admission: EM | Admit: 2019-01-25 | Discharge: 2019-01-25 | Disposition: A | Discharge status: Home / Self Care | Payer: Medicaid Other | Attending: Emergency Medicine | Admitting: Emergency Medicine

## 2019-01-25 ENCOUNTER — Other Ambulatory Visit: Payer: Self-pay

## 2019-01-25 DIAGNOSIS — Y999 Unspecified external cause status: Secondary | ICD-10-CM | POA: Insufficient documentation

## 2019-01-25 DIAGNOSIS — S61011A Laceration without foreign body of right thumb without damage to nail, initial encounter: Secondary | ICD-10-CM | POA: Insufficient documentation

## 2019-01-25 DIAGNOSIS — Y288XXA Contact with other sharp object, undetermined intent, initial encounter: Secondary | ICD-10-CM | POA: Insufficient documentation

## 2019-01-25 DIAGNOSIS — Y93G1 Activity, food preparation and clean up: Secondary | ICD-10-CM | POA: Insufficient documentation

## 2019-01-25 DIAGNOSIS — Y929 Unspecified place or not applicable: Secondary | ICD-10-CM | POA: Insufficient documentation

## 2019-01-25 DIAGNOSIS — F17210 Nicotine dependence, cigarettes, uncomplicated: Secondary | ICD-10-CM | POA: Diagnosis not present

## 2019-01-25 DIAGNOSIS — Z79899 Other long term (current) drug therapy: Secondary | ICD-10-CM | POA: Insufficient documentation

## 2019-01-25 MED ORDER — LIDOCAINE HCL (PF) 1 % IJ SOLN
5.0000 mL | Freq: Once | INTRAMUSCULAR | Status: AC
  Administered 2019-01-25: 5 mL via INTRADERMAL
  Filled 2019-01-25: qty 5

## 2019-01-25 NOTE — ED Triage Notes (Signed)
Right thumb laceration with knife while cooking PTA. Pt alert and oriented X4, cooperative, RR even and unlabored, color WNL. Pt in NAD.

## 2019-01-25 NOTE — ED Notes (Signed)
Appropriate color to R hand/thumb; radial pulse 2+; hand warm. Small lac to R thumb. Bleeding controlled.

## 2019-01-25 NOTE — Discharge Instructions (Addendum)
Follow-up with your regular doctor remove the sutures yourself in 7 to 10 days.  Return emergency department or see your regular doctor if any sign of infection.  Keep the area as dry as possible.  Tylenol or ibuprofen for pain if needed.

## 2019-01-25 NOTE — ED Notes (Signed)
Dressing applied per order. Pt sent with basic supplies to care for wound. Educated.

## 2019-01-25 NOTE — ED Provider Notes (Signed)
[facility] Emergency Department Provider Note  ____________________________________________   First MD Initiated Contact with Patient 01/25/19 1903     (approximate)  I have reviewed the triage vital signs and the nursing notes.   HISTORY  Chief Complaint Laceration    HPI Sandra Alexander is a 48 y.o. female presents emergency department with laceration to the right thumb.  She states she was using a potato peeler cut the distal portion of the right thumb.  Patient is right-handed.  Tdap is up-to-date.  No other injuries reported.    Past Medical History:  Diagnosis Date  . Anxiety   . Arthritis   . Fibromyalgia     There are no problems to display for this patient.   Past Surgical History:  Procedure Laterality Date  . ABLATION    . CHOLECYSTECTOMY    . KNEE SURGERY Right   . TUBAL LIGATION      Prior to Admission medications   Medication Sig Start Date End Date Taking? Authorizing Provider  fluticasone (FLONASE) 50 MCG/ACT nasal spray Place 2 sprays into both nostrils daily. 01/06/18   Norval Gable, MD  gabapentin (NEURONTIN) 300 MG capsule Take 300 mg by mouth 4 (four) times daily.    [provider]  meloxicam (MOBIC) 15 MG tablet Take 15 mg by mouth daily.    [provider]  pramipexole (MIRAPEX) 0.125 MG tablet Take 0.125 mg by mouth as directed.    [provider]  citalopram (CELEXA) 40 MG tablet Take 40 mg by mouth daily.  09/26/18  [provider]    Allergies Doxycycline and Sulfa antibiotics  Family History  Problem Relation Age of Onset  . Hypertension Mother   . Hyperlipidemia Mother   . Epilepsy Father     Social History Social History   Tobacco Use  . Smoking status: Current Every Day Smoker    Packs/day: 0.50  . Smokeless tobacco: Never Used  Substance Use Topics  . Alcohol use: Yes  . Drug use: No    Review of Systems  Constitutional: No fever/chills Eyes: No visual  changes. ENT: No sore throat. Respiratory: Denies cough Genitourinary: Negative for dysuria. Musculoskeletal: Negative for back pain.  Positive laceration to the right thumb Skin: Negative for rash.    ____________________________________________   PHYSICAL EXAM:  VITAL SIGNS: ED Triage Vitals  Enc Vitals Group     BP 01/25/19 1822 (!) 141/74     Pulse Rate 01/25/19 1822 85     Resp 01/25/19 1822 18     Temp 01/25/19 1822 99.4 F (37.4 C)     Temp Source 01/25/19 1822 Oral     SpO2 01/25/19 1822 100 %     Weight 01/25/19 1823 166 lb (75.3 kg)     Height 01/25/19 1823 5' (1.524 m)     Head Circumference --      Peak Flow --      Pain Score 01/25/19 1822 2     Pain Loc --      Pain Edu? --      Excl. in [place]? --     Constitutional: Alert and oriented. Well appearing and in no acute distress. Eyes: Conjunctivae are normal.  Head: Atraumatic. Nose: No congestion/rhinnorhea. Mouth/Throat: Mucous membranes are moist.   Neck:  supple no lymphadenopathy noted Cardiovascular: Normal rate, regular rhythm. Respiratory: Normal respiratory effort.  No retractions,  GU: deferred Musculoskeletal: FROM all extremities, warm and well perfused, positive for flap-like laceration noted  at the distal end of the right thumb, no foreign bodies noted, ligaments and tendons are intact.  Neurovascular is intact Neurologic:  Normal speech and language.  Skin:  Skin is warm, dry No rash noted. Psychiatric: Mood and affect are normal. Speech and behavior are normal.  ____________________________________________   LABS (all labs ordered are listed, but only abnormal results are displayed)  Labs Reviewed - No data to display ____________________________________________   ____________________________________________  RADIOLOGY    ____________________________________________   PROCEDURES  Procedure(s) performed:   Marland Kitchen.Marland Kitchen.Laceration Repair  Date/Time: 01/25/2019 8:00 PM Performed by:  Faythe GheeFisher, Bejamin Hackbart W, PA-C Authorized by: Faythe GheeFisher, Vivienne Sangiovanni W, PA-C   Consent:    Consent obtained:  Verbal   Consent given by:  Patient   Risks discussed:  Infection, pain, retained foreign body, tendon damage, poor cosmetic result, need for additional repair, nerve damage, poor wound healing and vascular damage Anesthesia (see MAR for exact dosages):    Anesthesia method:  Nerve block   Block needle gauge:  27 G   Block anesthetic:  Lidocaine 1% w/o epi   Block injection procedure:  Anatomic landmarks identified, introduced needle, incremental injection, anatomic landmarks palpated and negative aspiration for blood   Block outcome:  Anesthesia achieved Laceration details:    Location:  Finger   Finger location:  R thumb   Length (cm):  2 Repair type:    Repair type:  Simple Exploration:    Hemostasis achieved with:  Direct pressure   Wound exploration: wound explored through full range of motion     Wound extent: no foreign bodies/material noted, no muscle damage noted and no underlying fracture noted   Treatment:    Area cleansed with:  Betadine and saline   Amount of cleaning:  Standard   Irrigation solution:  Sterile saline   Irrigation method:  Syringe and tap Skin repair:    Repair method:  Sutures   Suture size:  5-0   Suture material:  Nylon   Suture technique:  Simple interrupted   Number of sutures:  4 Approximation:    Approximation:  Close Post-procedure details:    Dressing:  Non-adherent dressing   Patient tolerance of procedure:  Tolerated well, no immediate complications      ____________________________________________   INITIAL IMPRESSION / ASSESSMENT AND PLAN / ED COURSE  Pertinent labs & imaging results that were available during my care of the patient were reviewed by me and considered in my medical decision making (see chart for details).   Patient's 48 year old female presents emergency department with laceration to the right thumb.  Physical exam  does show a flap-like laceration to the distal portion of the right thumb.  See procedure note for repair.  Patient tolerated the procedure well.  She will be discharged with instructions to remove sutures in 7 to 10 days.  Keep the area as dry as possible.  If any sign of infection return emergency department or see your regular doctor.  She states she understands and will comply.  She was discharged in stable condition.    Sandra Brinkngela Lonzo was evaluated in Emergency Department on 01/25/2019 for the symptoms described in the history of present illness. She was evaluated in the context of the global COVID-19 pandemic, which necessitated consideration that the patient might be at risk for infection with the SARS-CoV-2 virus that causes COVID-19. Institutional protocols and algorithms that pertain to the evaluation of patients at risk for COVID-19 are in a state of rapid change based on information  released by regulatory bodies including the CDC and federal and state organizations. These policies and algorithms were followed during the patient's care in the ED.   As part of my medical decision making, I reviewed the following data within the electronic MEDICAL RECORD NUMBER Nursing notes reviewed and incorporated, Old chart reviewed, Notes from prior ED visits and [place] Controlled Substance Database  ____________________________________________   FINAL CLINICAL IMPRESSION(S) / ED DIAGNOSES  Final diagnoses:  Laceration of right thumb without foreign body without damage to nail, initial encounter      NEW MEDICATIONS STARTED DURING THIS VISIT:  New Prescriptions   No medications on file     Note:  This document was prepared using Dragon voice recognition software and may include unintentional dictation errors.    Faythe Ghee, PA-C 01/25/19 2002    Emily Filbert, MD 01/25/19 2114

## 2019-01-28 DIAGNOSIS — Z20828 Contact with and (suspected) exposure to other viral communicable diseases: Secondary | ICD-10-CM | POA: Diagnosis not present

## 2019-01-28 DIAGNOSIS — M79672 Pain in left foot: Secondary | ICD-10-CM | POA: Diagnosis not present

## 2019-01-29 DIAGNOSIS — M2012 Hallux valgus (acquired), left foot: Secondary | ICD-10-CM | POA: Diagnosis not present

## 2019-01-29 DIAGNOSIS — M79671 Pain in right foot: Secondary | ICD-10-CM | POA: Diagnosis not present

## 2019-01-29 DIAGNOSIS — M2011 Hallux valgus (acquired), right foot: Secondary | ICD-10-CM | POA: Diagnosis not present

## 2019-01-29 DIAGNOSIS — M79672 Pain in left foot: Secondary | ICD-10-CM | POA: Diagnosis not present

## 2019-01-30 DIAGNOSIS — M2011 Hallux valgus (acquired), right foot: Secondary | ICD-10-CM | POA: Diagnosis not present

## 2019-01-30 DIAGNOSIS — M2012 Hallux valgus (acquired), left foot: Secondary | ICD-10-CM | POA: Diagnosis not present

## 2019-03-06 DIAGNOSIS — R253 Fasciculation: Secondary | ICD-10-CM | POA: Diagnosis not present

## 2019-03-06 DIAGNOSIS — G932 Benign intracranial hypertension: Secondary | ICD-10-CM | POA: Diagnosis not present

## 2019-03-12 DIAGNOSIS — M79671 Pain in right foot: Secondary | ICD-10-CM | POA: Diagnosis not present

## 2019-04-02 DIAGNOSIS — R519 Headache, unspecified: Secondary | ICD-10-CM | POA: Diagnosis not present

## 2019-04-02 DIAGNOSIS — H16213 Exposure keratoconjunctivitis, bilateral: Secondary | ICD-10-CM | POA: Diagnosis not present

## 2019-04-02 DIAGNOSIS — R9089 Other abnormal findings on diagnostic imaging of central nervous system: Secondary | ICD-10-CM | POA: Diagnosis not present

## 2019-05-10 DIAGNOSIS — G5601 Carpal tunnel syndrome, right upper limb: Secondary | ICD-10-CM | POA: Diagnosis not present

## 2019-05-10 DIAGNOSIS — F419 Anxiety disorder, unspecified: Secondary | ICD-10-CM | POA: Diagnosis not present

## 2019-05-10 DIAGNOSIS — M79642 Pain in left hand: Secondary | ICD-10-CM | POA: Diagnosis not present

## 2019-05-10 DIAGNOSIS — Z1159 Encounter for screening for other viral diseases: Secondary | ICD-10-CM | POA: Diagnosis not present

## 2019-05-10 DIAGNOSIS — M79641 Pain in right hand: Secondary | ICD-10-CM | POA: Diagnosis not present

## 2019-06-08 DIAGNOSIS — J069 Acute upper respiratory infection, unspecified: Secondary | ICD-10-CM | POA: Diagnosis not present

## 2019-10-23 DIAGNOSIS — J310 Chronic rhinitis: Secondary | ICD-10-CM | POA: Diagnosis not present

## 2019-10-23 DIAGNOSIS — Z720 Tobacco use: Secondary | ICD-10-CM | POA: Diagnosis not present

## 2019-10-23 DIAGNOSIS — J329 Chronic sinusitis, unspecified: Secondary | ICD-10-CM | POA: Diagnosis not present

## 2019-11-09 DIAGNOSIS — Z20822 Contact with and (suspected) exposure to covid-19: Secondary | ICD-10-CM | POA: Diagnosis not present

## 2019-12-11 DIAGNOSIS — Z0000 Encounter for general adult medical examination without abnormal findings: Secondary | ICD-10-CM | POA: Diagnosis not present

## 2019-12-11 DIAGNOSIS — E782 Mixed hyperlipidemia: Secondary | ICD-10-CM | POA: Diagnosis not present

## 2019-12-11 DIAGNOSIS — Z23 Encounter for immunization: Secondary | ICD-10-CM | POA: Diagnosis not present

## 2019-12-13 DIAGNOSIS — Z23 Encounter for immunization: Secondary | ICD-10-CM | POA: Diagnosis not present

## 2020-01-02 DIAGNOSIS — Z23 Encounter for immunization: Secondary | ICD-10-CM | POA: Diagnosis not present

## 2020-01-18 DIAGNOSIS — L508 Other urticaria: Secondary | ICD-10-CM | POA: Diagnosis not present

## 2020-03-14 DIAGNOSIS — G2581 Restless legs syndrome: Secondary | ICD-10-CM | POA: Diagnosis not present

## 2020-03-14 DIAGNOSIS — R253 Fasciculation: Secondary | ICD-10-CM | POA: Diagnosis not present

## 2020-04-16 DIAGNOSIS — R0981 Nasal congestion: Secondary | ICD-10-CM | POA: Diagnosis not present

## 2020-04-16 DIAGNOSIS — F339 Major depressive disorder, recurrent, unspecified: Secondary | ICD-10-CM | POA: Diagnosis not present

## 2020-04-16 DIAGNOSIS — M654 Radial styloid tenosynovitis [de Quervain]: Secondary | ICD-10-CM | POA: Diagnosis not present

## 2020-04-16 DIAGNOSIS — M62830 Muscle spasm of back: Secondary | ICD-10-CM | POA: Diagnosis not present

## 2020-05-28 ENCOUNTER — Ambulatory Visit: Payer: Self-pay

## 2020-08-25 ENCOUNTER — Other Ambulatory Visit: Payer: Self-pay

## 2020-08-25 ENCOUNTER — Ambulatory Visit
Admission: RE | Admit: 2020-08-25 | Discharge: 2020-08-25 | Disposition: A | Discharge status: Home / Self Care | Payer: Medicaid Other | Source: Ambulatory Visit

## 2020-08-25 ENCOUNTER — Ambulatory Visit (INDEPENDENT_AMBULATORY_CARE_PROVIDER_SITE_OTHER): Payer: Medicaid Other

## 2020-08-25 VITALS — BP 120/78 | HR 87 | Temp 98.4°F | Resp 18 | Ht 60.0 in | Wt 170.0 lb

## 2020-08-25 DIAGNOSIS — S8002XA Contusion of left knee, initial encounter: Secondary | ICD-10-CM

## 2020-08-25 DIAGNOSIS — M25562 Pain in left knee: Secondary | ICD-10-CM

## 2020-08-25 MED ORDER — HYDROCODONE-ACETAMINOPHEN 5-325 MG PO TABS: 1.0000 | ORAL_TABLET | Freq: Four times a day (QID) | ORAL | 0 refills | Status: DC | PRN

## 2020-08-25 NOTE — ED Triage Notes (Signed)
Pt c/o fall yesterday, landing on her left knee. Pt reports pain and a knot on her knee cap. Pt states she feels a "clicking" when walking.

## 2020-08-25 NOTE — Discharge Instructions (Addendum)
Keep your left knee elevated much as possible to decrease pain and swelling.  Wear the knee brace to provide support and stability while you are the inflammation resolves.  Use Tylenol and ibuprofen according to the package instructions for mild to moderate pain and use the Norco as needed for severe pain.  You can apply ice or moist heat to your knee for 20-minute at a time 2-3 times a day to help with inflammation and pain.  If your symptoms do not improve follow-up with [facility].

## 2020-08-25 NOTE — ED Provider Notes (Signed)
[facility]    CSN: [id number] Arrival date & time: 08/25/20  1805      History   Chief Complaint Chief Complaint  Patient presents with   Knee Pain    Left, s/p fall 08/24/20    HPI Sandra Alexander is a 50 y.o. female.   HPI  50 year old female here for evaluation of left knee pain.  Patient reports that she was walking into a store yesterday when she slipped on some water and landed on her left kneecap on a tile floor.  She states that she was able to get herself up and bear weight but with a great deal of pain.  She reports that she is having pain in the front and lower aspect of her kneecap of her left knee and feels that click whenever she walks or bends her knee.  She is also having intermittent tingling in her lower extremity but she denies numbness.  Past Medical History:  Diagnosis Date   Anxiety    Arthritis    Fibromyalgia     There are no problems to display for this patient.   Past Surgical History:  Procedure Laterality Date   ABLATION     CHOLECYSTECTOMY     KNEE SURGERY Right    TUBAL LIGATION      OB History   No obstetric history on file.      Home Medications    Prior to Admission medications   Medication Sig Start Date End Date Taking? Authorizing Provider  amitriptyline (ELAVIL) 25 MG tablet Take 50 mg by mouth at bedtime. 06/19/20  Yes [provider]  fluticasone (FLONASE) 50 MCG/ACT nasal spray Place 2 sprays into both nostrils daily. 01/06/18  Yes Payton Mccallum, MD  gabapentin (NEURONTIN) 400 MG capsule Take 800 mg by mouth 3 (three) times daily. 06/19/20  Yes [provider]  HYDROcodone-acetaminophen (NORCO/VICODIN) 5-325 MG tablet Take 1 tablet by mouth every 6 (six) hours as needed. 08/25/20  Yes Becky Augusta, NP  meloxicam (MOBIC) 15 MG tablet Take 15 mg by mouth daily.   Yes [provider]  oxybutynin (DITROPAN-XL) 5 MG 24 hr tablet Take 1 tablet by mouth daily. 07/04/20  Yes [provider]  pramipexole (MIRAPEX) 0.125 MG tablet Take 0.125 mg by mouth as directed.   Yes [provider]  citalopram (CELEXA) 40 MG tablet Take 40 mg by mouth daily.  09/26/18  [provider]    Family History Family History  Problem Relation Age of Onset   Hypertension Mother    Hyperlipidemia Mother    Epilepsy Father     Social History Social History   Tobacco Use   Smoking status: Every Day    Packs/day: 0.50    Pack years: 0.00    Types: Cigarettes   Smokeless tobacco: Never  Vaping Use   Vaping Use: Never used  Substance Use Topics   Alcohol use: Yes   Drug use: No     Allergies   Tegretol [carbamazepine], Doxycycline, and Sulfa antibiotics   Review of Systems Review of Systems  Musculoskeletal:  Positive for arthralgias.  Skin:  Negative for color change and wound.  Neurological:  Negative for weakness and numbness.  Hematological: Negative.   Psychiatric/Behavioral: Negative.      Physical Exam Triage Vital Signs ED Triage Vitals  Enc Vitals Group     BP 08/25/20 1843 120/78     Pulse Rate 08/25/20 1843 87     Resp 08/25/20 1843  18     Temp 08/25/20 1843 98.4 F (36.9 C)     Temp Source 08/25/20 1843 Oral     SpO2 08/25/20 1843 98 %     Weight 08/25/20 1839 170 lb (77.1 kg)     Height 08/25/20 1839 5' (1.524 m)     Head Circumference --      Peak Flow --      Pain Score 08/25/20 1838 6     Pain Loc --      Pain Edu? --      Excl. in GC? --    No data found.  Updated Vital Signs BP 120/78 (BP Location: Left Arm)   Pulse 87   Temp 98.4 F (36.9 C) (Oral)   Resp 18   Ht 5' (1.524 m)   Wt 170 lb (77.1 kg)   LMP 06/04/2015   SpO2 98%   BMI 33.20 kg/m   Visual Acuity Right Eye Distance:   Left Eye Distance:   Bilateral Distance:    Right Eye Near:   Left Eye Near:    Bilateral Near:     Physical Exam Vitals and nursing note reviewed.  Constitutional:      Appearance: Normal appearance. She is obese. She is not  ill-appearing.  Musculoskeletal:        General: Tenderness present. No swelling or deformity. Normal range of motion.  Skin:    General: Skin is warm and dry.     Capillary Refill: Capillary refill takes less than 2 seconds.     Findings: No bruising or erythema.  Neurological:     General: No focal deficit present.     Mental Status: She is alert and oriented to person, place, and time.  Psychiatric:        Mood and Affect: Mood normal.        Behavior: Behavior normal.        Thought Content: Thought content normal.        Judgment: Judgment normal.     UC Treatments / Results  Labs (all labs ordered are listed, but only abnormal results are displayed) Labs Reviewed - No data to display  EKG   Radiology DG Knee Complete 4 Views Left  Result Date: 08/25/2020 CLINICAL DATA:  Recent fall with left knee pain and anterior swelling, initial encounter EXAM: LEFT KNEE - COMPLETE 4+ VIEW COMPARISON:  None. FINDINGS: No evidence of fracture, dislocation, or joint effusion. No evidence of arthropathy or other focal bone abnormality. Soft tissues are unremarkable. IMPRESSION: No acute abnormality noted. Electronically Signed   By: Alcide Clever M.D.   On: 08/25/2020 20:20    Procedures Procedures (including critical care time)  Medications Ordered in UC Medications - No data to display  Initial Impression / Assessment and Plan / UC Course  I have reviewed the triage vital signs and the nursing notes.  Pertinent labs & imaging results that were available during my care of the patient were reviewed by me and considered in my medical decision making (see chart for details).  Patient is a pleasant, nontoxic-appearing 50 year old female here for evaluation of left knee pain as outlined in the HPI above.  Patient's physical exam reveals a left knee that is in normal anatomical alignment.  There is no erythema, ecchymosis, or edema noted.  Patient does complain of tenderness when palpating  the inferior aspect of the patella as well as the mid aspect.  There is no tenderness when palpating the quadriceps complex,  medial or lateral joint lines, or tibial tuberosity.  Patient does complain of some mild tenderness when palpating the popliteal fossa.  Anterior and posterior drawer test are negative.  There is no tenderness elicited with varus and valgus stress application.  There is some localized mild swelling to the inferior and lateral aspect of the left patella over the patellar tendon.  Will obtain radiograph of left knee.  Left knee x-ray independently reviewed and evaluated by me.  Interpretation: No evidence of fracture or dislocation.  There is no evidence of major soft tissue swelling.  Awaiting radiology overread. Radiology overread is that there is no bony abnormality.  Will discharge patient home with a diagnosis of contusion of her left knee.  We will give patient a knee brace to help provide stability, have her keep her leg elevated is much as possible, use over-the-counter Tylenol ibuprofen as needed for mild to moderate pain and Norco as needed for severe pain.   Final Clinical Impressions(s) / UC Diagnoses   Final diagnoses:  Contusion of left knee, initial encounter     Discharge Instructions      Keep your left knee elevated much as possible to decrease pain and swelling.  Wear the knee brace to provide support and stability while you are the inflammation resolves.  Use Tylenol and ibuprofen according to the package instructions for mild to moderate pain and use the Norco as needed for severe pain.  You can apply ice or moist heat to your knee for 20-minute at a time 2-3 times a day to help with inflammation and pain.  If your symptoms do not improve follow-up with [facility].     ED Prescriptions     Medication Sig Dispense Auth. Provider   HYDROcodone-acetaminophen (NORCO/VICODIN) 5-325 MG tablet Take 1 tablet by mouth every 6 (six) hours as needed.  10 tablet Becky Augusta, NP      I have reviewed the PDMP during this encounter.   Becky Augusta, NP 08/25/20 2029

## 2020-09-11 DIAGNOSIS — M654 Radial styloid tenosynovitis [de Quervain]: Secondary | ICD-10-CM | POA: Diagnosis not present

## 2020-09-11 DIAGNOSIS — F339 Major depressive disorder, recurrent, unspecified: Secondary | ICD-10-CM | POA: Diagnosis not present

## 2020-09-11 DIAGNOSIS — M25462 Effusion, left knee: Secondary | ICD-10-CM | POA: Diagnosis not present

## 2020-09-11 DIAGNOSIS — S8002XD Contusion of left knee, subsequent encounter: Secondary | ICD-10-CM | POA: Diagnosis not present

## 2020-09-17 DIAGNOSIS — M25562 Pain in left knee: Secondary | ICD-10-CM | POA: Diagnosis not present

## 2020-09-17 DIAGNOSIS — S8002XA Contusion of left knee, initial encounter: Secondary | ICD-10-CM | POA: Diagnosis not present

## 2020-09-17 DIAGNOSIS — W1839XA Other fall on same level, initial encounter: Secondary | ICD-10-CM | POA: Diagnosis not present

## 2021-02-03 ENCOUNTER — Ambulatory Visit
Admission: RE | Admit: 2021-02-03 | Discharge: 2021-02-03 | Disposition: A | Discharge status: Home / Self Care | Payer: Medicaid Other | Source: Ambulatory Visit | Attending: Internal Medicine | Admitting: Internal Medicine

## 2021-02-03 ENCOUNTER — Other Ambulatory Visit: Payer: Self-pay

## 2021-02-03 VITALS — BP 129/80 | HR 86 | Temp 98.5°F | Resp 18 | Wt 160.0 lb

## 2021-02-03 DIAGNOSIS — J209 Acute bronchitis, unspecified: Secondary | ICD-10-CM | POA: Diagnosis not present

## 2021-02-03 MED ORDER — BENZONATATE 200 MG PO CAPS: 200.0000 mg | ORAL_CAPSULE | Freq: Three times a day (TID) | ORAL | 0 refills | Status: DC | PRN

## 2021-02-03 MED ORDER — ALBUTEROL SULFATE HFA 108 (90 BASE) MCG/ACT IN AERS: 1.0000 | INHALATION_SPRAY | RESPIRATORY_TRACT | 0 refills | Status: AC | PRN

## 2021-02-03 MED ORDER — PREDNISONE 50 MG PO TABS: 50.0000 mg | ORAL_TABLET | Freq: Every day | ORAL | 0 refills | Status: DC

## 2021-02-03 NOTE — ED Triage Notes (Signed)
Patient is here for congestion, cough. Started with "stuffy/runny nose". Cough "productive"& worse at night. No fever. Symptoms began "last Tuesday". No @ home COVID19 testing. COVID19 vaccines done. Flu vaccine "not done yet".

## 2021-02-03 NOTE — ED Provider Notes (Signed)
[facility]    CSN: [id number] Arrival date & time: 02/03/21  1200      History   Chief Complaint Chief Complaint  Patient presents with   Cough    Appt@12    Nasal Congestion    HPI Sandra Alexander is a 50 y.o. female.  She presents today with 1 week history of respiratory symptoms.  She had the onset of fever, chills, and sore throat about a week ago, now has runny/congested nose and some coughing.  Coughing is worse at night.  Not vomiting, no diarrhea.  HPI  Past Medical History:  Diagnosis Date   Anxiety    Arthritis    Fibromyalgia     There are no problems to display for this patient.   Past Surgical History:  Procedure Laterality Date   ABLATION     CHOLECYSTECTOMY     KNEE SURGERY Right    TUBAL LIGATION        Home Medications    Prior to Admission medications   Medication Sig Start Date End Date Taking? Authorizing Provider  albuterol (VENTOLIN HFA) 108 (90 Base) MCG/ACT inhaler Inhale 1-2 puffs into the lungs every 4 (four) hours as needed for wheezing or shortness of breath. 02/03/21  Yes Isa Rankin, MD  amitriptyline (ELAVIL) 25 MG tablet Take 50 mg by mouth at bedtime. 06/19/20  Yes [provider]  benzonatate (TESSALON) 200 MG capsule Take 1 capsule (200 mg total) by mouth 3 (three) times daily as needed for cough. 02/03/21  Yes Isa Rankin, MD  gabapentin (NEURONTIN) 400 MG capsule Take 800 mg by mouth 3 (three) times daily. 06/19/20  Yes [provider]  pramipexole (MIRAPEX) 0.125 MG tablet Take 0.125 mg by mouth as directed.   Yes [provider]  predniSONE (DELTASONE) 50 MG tablet Take 1 tablet (50 mg total) by mouth daily. 02/03/21  Yes Isa Rankin, MD  fluticasone [facility]) 50 MCG/ACT nasal spray Place 2 sprays into both nostrils daily. 01/06/18   Payton Mccallum, MD  HYDROcodone-acetaminophen (NORCO/VICODIN) 5-325 MG tablet Take 1 tablet by mouth every 6 (six) hours as needed.  08/25/20   Becky Augusta, NP  meloxicam (MOBIC) 15 MG tablet Take 15 mg by mouth daily.    [provider]  oxybutynin (DITROPAN-XL) 5 MG 24 hr tablet Take 1 tablet by mouth daily. 07/04/20   [provider]  citalopram (CELEXA) 40 MG tablet Take 40 mg by mouth daily.  09/26/18  [provider]    Family History Family History  Problem Relation Age of Onset   Hypertension Mother    Hyperlipidemia Mother    Epilepsy Father     Social History Social History   Tobacco Use   Smoking status: Every Day    Packs/day: 0.50    Types: Cigarettes   Smokeless tobacco: Never  Vaping Use   Vaping Use: Never used  Substance Use Topics   Alcohol use: Yes   Drug use: No     Allergies   Tegretol [carbamazepine], Doxycycline, and Sulfa antibiotics   Review of Systems Review of Systems see HPI   Physical Exam Triage Vital Signs ED Triage Vitals [02/03/21 1224]  Enc Vitals Group     BP 129/80     Pulse Rate 86     Resp 18     Temp 98.5 F (36.9 C)     Temp Source Oral     SpO2 97 %     Weight 160  lb (72.6 kg)     Height      Pain Score 0     Pain Loc     Updated Vital Signs BP 129/80 (BP Location: Left Arm)    Pulse 86    Temp 98.5 F (36.9 C) (Oral)    Resp 18    Wt 72.6 kg    LMP 06/04/2015    SpO2 97%    BMI 31.25 kg/m   Physical Exam Vitals reviewed.  Constitutional:      General: She is not in acute distress.    Appearance: She is not ill-appearing or toxic-appearing.     Comments: Nicely groomed  HENT:     Head: Atraumatic.     Comments: Bilateral TMs unremarkable Moderate nasal congestion bilaterally Throat slightly injected with postnasal drainage evident Eyes:     Conjunctiva/sclera:     Right eye: Right conjunctiva is not injected. No exudate.    Left eye: Left conjunctiva is not injected. No exudate.    Comments: Conjugate gaze observed  Cardiovascular:     Rate and Rhythm: Normal rate and regular rhythm.  Pulmonary:      Effort: Pulmonary effort is normal. No respiratory distress.     Breath sounds: No wheezing or rhonchi.  Abdominal:     General: There is no distension.  Musculoskeletal:     Cervical back: Neck supple.     Comments: Walked into the urgent care independently  Skin:    General: Skin is warm and dry.     Comments: Pink, no cyanosis  Neurological:     Mental Status: She is alert.     Comments: Face symmetric, speech clear, coherent     UC Treatments / Results  Labs (all labs ordered are listed, but only abnormal results are displayed) Labs Reviewed - No data to display No labs done at urgent care visit  EKG N/A  Radiology No results found. No imaging done at urgent care visit  Procedures Procedures (including critical care time) N/A  Medications Ordered in UC Medications - No data to display No meds given at urgent care visit   Final Clinical Impressions(s) / UC Diagnoses   Final diagnoses:  Acute bronchitis, unspecified organism     Discharge Instructions      Symptoms and exam today are most consistent with bronchitis after a viral respiratory infection.  Prescriptions for albuterol (for cough, wheezing), prednisone (for chest tightness, wheezing) and benzonatate (for cough) were sent to the pharmacy.  Push fluids and rest.  Take tylenol or advil otc as needed for fever, discomfort.  Eat fruits and vegetables to help your immune system do its best work.  Anticipate gradual improvement over the next several days.  Recheck for new fever >100.5, increasing phlegm production/nasal discharge, or if not starting to improve in a few days.      ED Prescriptions     Medication Sig Dispense Auth. Provider   predniSONE (DELTASONE) 50 MG tablet Take 1 tablet (50 mg total) by mouth daily. 3 tablet Isa Rankin, MD   albuterol (VENTOLIN HFA) 108 (90 Base) MCG/ACT inhaler Inhale 1-2 puffs into the lungs every 4 (four) hours as needed for wheezing or shortness of breath.  1 each Isa Rankin, MD   benzonatate (TESSALON) 200 MG capsule Take 1 capsule (200 mg total) by mouth 3 (three) times daily as needed for cough. 30 capsule Isa Rankin, MD      PDMP not reviewed this encounter.  Isa Rankin, MD 02/04/21 [phone]

## 2021-02-03 NOTE — Discharge Instructions (Signed)
Symptoms and exam today are most consistent with bronchitis after a viral respiratory infection.  Prescriptions for albuterol (for cough, wheezing), prednisone (for chest tightness, wheezing) and benzonatate (for cough) were sent to the pharmacy.  Push fluids and rest.  Take tylenol or advil otc as needed for fever, discomfort.  Eat fruits and vegetables to help your immune system do its best work.  Anticipate gradual improvement over the next several days.  Recheck for new fever >100.5, increasing phlegm production/nasal discharge, or if not starting to improve in a few days.

## 2021-02-11 ENCOUNTER — Telehealth: Payer: Self-pay

## 2021-02-11 NOTE — Telephone Encounter (Signed)
.. °  Medicaid Managed Care   Unsuccessful Outreach Note  02/11/2021 Name: Sandra Alexander MRN: [id number] DOB: 1970-11-15  Referred by: [facility] Reason for referral : High Risk Managed Medicaid (Called patient today to get her scheduled with the MM Team. She did not answer and her VM is not set up.)   An unsuccessful telephone outreach was attempted today. The patient was referred to the case management team for assistance with care management and care coordination.   Follow Up Plan: The care management team will reach out to the patient again over the next 14 days.   Weston Settle Care Guide, High Risk Medicaid Managed Care Embedded Care Coordination [facility]   [facility]

## 2022-04-21 ENCOUNTER — Ambulatory Visit
Admission: RE | Admit: 2022-04-21 | Discharge: 2022-04-21 | Disposition: A | Discharge status: Home / Self Care | Payer: BC Managed Care – PPO | Source: Ambulatory Visit | Attending: Family Medicine | Admitting: Family Medicine

## 2022-04-21 VITALS — BP 128/80 | HR 83 | Temp 99.0°F | Resp 16

## 2022-04-21 DIAGNOSIS — R197 Diarrhea, unspecified: Secondary | ICD-10-CM | POA: Diagnosis not present

## 2022-04-21 DIAGNOSIS — R1013 Epigastric pain: Secondary | ICD-10-CM | POA: Insufficient documentation

## 2022-04-21 DIAGNOSIS — Z1152 Encounter for screening for COVID-19: Secondary | ICD-10-CM | POA: Insufficient documentation

## 2022-04-21 DIAGNOSIS — R1110 Vomiting, unspecified: Secondary | ICD-10-CM | POA: Insufficient documentation

## 2022-04-21 DIAGNOSIS — R1084 Generalized abdominal pain: Secondary | ICD-10-CM | POA: Insufficient documentation

## 2022-04-21 DIAGNOSIS — R509 Fever, unspecified: Secondary | ICD-10-CM | POA: Insufficient documentation

## 2022-04-21 LAB — COMPREHENSIVE METABOLIC PANEL
ALT: 23 U/L (ref 0–44)
AST: 21 U/L (ref 15–41)
Albumin: 4 g/dL (ref 3.5–5.0)
Alkaline Phosphatase: 63 U/L (ref 38–126)
Anion gap: 7 (ref 5–15)
BUN: 16 mg/dL (ref 6–20)
CO2: 30 mmol/L (ref 22–32)
Calcium: 8.8 mg/dL — ABNORMAL LOW (ref 8.9–10.3)
Chloride: 103 mmol/L (ref 98–111)
Creatinine, Ser: 0.91 mg/dL (ref 0.44–1.00)
GFR, Estimated: >60 mL/min (ref >60)
Glucose, Bld: 100 mg/dL — ABNORMAL HIGH (ref 70–99)
Potassium: 3.8 mmol/L (ref 3.5–5.1)
Sodium: 140 mmol/L (ref 135–145)
Total Bilirubin: 0.4 mg/dL (ref 0.3–1.2)
Total Protein: 7.5 g/dL (ref 6.5–8.1)

## 2022-04-21 LAB — CBC WITH DIFFERENTIAL/PLATELET
Abs Immature Granulocytes: 0.02 10*3/uL (ref 0.00–0.07)
Basophils Absolute: 0 10*3/uL (ref 0.0–0.1)
Basophils Relative: 0 %
Eosinophils Absolute: 0.1 10*3/uL (ref 0.0–0.5)
Eosinophils Relative: 1 %
HCT: 39.5 % (ref 36.0–46.0)
Hemoglobin: 13.7 g/dL (ref 12.0–15.0)
Immature Granulocytes: 0 %
Lymphocytes Relative: 16 %
Lymphs Abs: 1.2 10*3/uL (ref 0.7–4.0)
MCH: 29.1 pg (ref 26.0–34.0)
MCHC: 34.7 g/dL (ref 30.0–36.0)
MCV: 83.9 fL (ref 80.0–100.0)
Monocytes Absolute: 0.5 10*3/uL (ref 0.1–1.0)
Monocytes Relative: 6 %
Neutro Abs: 6.1 10*3/uL (ref 1.7–7.7)
Neutrophils Relative %: 77 %
Platelets: 224 10*3/uL (ref 150–400)
RBC: 4.71 MIL/uL (ref 3.87–5.11)
RDW: 12.1 % (ref 11.5–15.5)
WBC: 7.8 10*3/uL (ref 4.0–10.5)
nRBC: 0 % (ref 0.0–0.2)

## 2022-04-21 LAB — URINALYSIS, W/ REFLEX TO CULTURE (INFECTION SUSPECTED)
Glucose, UA: NEGATIVE mg/dL
Hgb urine dipstick: NEGATIVE
Ketones, ur: 15 mg/dL — AB
Nitrite: NEGATIVE
Protein, ur: 30 mg/dL — AB
Specific Gravity, Urine: 1.02 (ref 1.005–1.030)
pH: 7.5 (ref 5.0–8.0)

## 2022-04-21 LAB — RESP PANEL BY RT-PCR (RSV, FLU A&B, COVID)  RVPGX2
Influenza A by PCR: NEGATIVE
Influenza B by PCR: NEGATIVE
Resp Syncytial Virus by PCR: NEGATIVE
SARS Coronavirus 2 by RT PCR: NEGATIVE

## 2022-04-21 LAB — LIPASE, BLOOD: Lipase: 40 U/L (ref 11–51)

## 2022-04-21 LAB — VITAMIN B12: Vitamin B-12: 324 pg/mL (ref 180–914)

## 2022-04-21 MED ORDER — ALUM & MAG HYDROXIDE-SIMETH 200-200-20 MG/5ML PO SUSP
30.0000 mL | Freq: Once | ORAL | Status: AC
  Administered 2022-04-21: 30 mL via ORAL

## 2022-04-21 MED ORDER — OMEPRAZOLE 40 MG PO CPDR: 40.0000 mg | DELAYED_RELEASE_CAPSULE | Freq: Every day | ORAL | 0 refills | Status: AC

## 2022-04-21 MED ORDER — LIDOCAINE VISCOUS HCL 2 % MT SOLN
15.0000 mL | Freq: Once | OROMUCOSAL | Status: AC
  Administered 2022-04-21: 15 mL via OROMUCOSAL

## 2022-04-21 MED ORDER — ONDANSETRON 4 MG PO TBDP: 4.0000 mg | ORAL_TABLET | Freq: Three times a day (TID) | ORAL | 0 refills | Status: AC | PRN

## 2022-04-21 MED ORDER — FAMOTIDINE 20 MG PO TABS: 20.0000 mg | ORAL_TABLET | Freq: Two times a day (BID) | ORAL | 0 refills | Status: AC

## 2022-04-21 MED ORDER — CEFDINIR 300 MG PO CAPS: 300.0000 mg | ORAL_CAPSULE | Freq: Two times a day (BID) | ORAL | 0 refills | Status: AC

## 2022-04-21 NOTE — Discharge Instructions (Addendum)
Your COVID, influenza and RSV tests are all negative. Your blood work did not show cause of your symptoms. Your urine culture and Vitamin B12 results will come back in the next 2-3 days. We will contact your if you need to change antibiotics.    For your burning sensation: you may have acid reflux.  Take omeprazole once daily and pepcid twice daily to help relieve your pain. Follow up with your primary care provider to discuss if you need to be tested for H.pylori bacteria.    Zofran was sent to the pharmacy to help with nausea.

## 2022-04-21 NOTE — ED Triage Notes (Signed)
Pt c/o diarrhea, abdominal pain, fatigue, left side facial pain and back pain x 3 days. Pt has tried OTC pain medication for pain and pepto bismol for her abdominal pain.

## 2022-04-21 NOTE — ED Provider Notes (Signed)
[facility]    CSN: [id number] Arrival date & time: 04/21/22  1755      History   Chief Complaint Chief Complaint  Patient presents with   Abdominal Pain    I got sick on Sunday the 3rd and I have been having issues with my stomach since then. I am having back pain as well. I am also having pain around my left ear/mouth. - Entered by patient    HPI Cherlynn Popiel is a 52 y.o. female.   HPI  Dorian presents for abdominal pain since Sunday. Around 830 AM she had nausea and felt like her stomach was on fire. She tried eating some food and then it got worse. She nearly got sick in her car. She vomited and had diarrhea. Monday, she woke up with firey feeling in her stomach. Abdominal hurts to touch and in the right rib area. Has back pain that has resolved. Has been taking Pepto Bismol. Has "gurgling ."  There were a lot of people out from work with similar sx. She had chills and felt feverish but no documented fever.   Has pain in the left side of her face and left ear. Has a lot of issues with her sinuses before the "GI crap."  Has puffiness on the left side of her face.    Past Medical History:  Diagnosis Date   Anxiety    Arthritis    Fibromyalgia     There are no problems to display for this patient.   Past Surgical History:  Procedure Laterality Date   ABLATION     CHOLECYSTECTOMY     KNEE SURGERY Right    TUBAL LIGATION      OB History   No obstetric history on file.      Home Medications    Prior to Admission medications   Medication Sig Start Date End Date Taking? Authorizing Provider  albuterol (VENTOLIN HFA) 108 (90 Base) MCG/ACT inhaler Inhale 1-2 puffs into the lungs every 4 (four) hours as needed for wheezing or shortness of breath. 02/03/21  Yes Wynona Luna, MD  amitriptyline (ELAVIL) 25 MG tablet Take 50 mg by mouth at bedtime. 06/19/20  Yes [provider]  atorvastatin (LIPITOR) 20 MG tablet Take 1 tablet by mouth daily.  06/02/21  Yes [provider]  cefdinir (OMNICEF) 300 MG capsule Take 1 capsule (300 mg total) by mouth 2 (two) times daily for 7 days. 04/21/22 04/28/22 Yes Jabree Rebert, DO  famotidine (PEPCID) 20 MG tablet Take 1 tablet (20 mg total) by mouth 2 (two) times daily. 04/21/22  Yes Baneen Wieseler, DO  fluticasone (FLONASE) 50 MCG/ACT nasal spray Place 2 sprays into both nostrils daily. 01/06/18  Yes Norval Gable, MD  gabapentin (NEURONTIN) 400 MG capsule Take 800 mg by mouth 3 (three) times daily. 06/19/20  Yes [provider]  omeprazole (PRILOSEC) 40 MG capsule Take 1 capsule (40 mg total) by mouth daily. 04/21/22  Yes Agnes Brightbill, DO  ondansetron (ZOFRAN-ODT) 4 MG disintegrating tablet Take 1 tablet (4 mg total) by mouth every 8 (eight) hours as needed. 04/21/22  Yes Giavonni Cizek, DO  oxybutynin (DITROPAN-XL) 5 MG 24 hr tablet Take 1 tablet by mouth daily. 07/04/20  Yes [provider]  pramipexole (MIRAPEX) 0.125 MG tablet Take 0.125 mg by mouth as directed.   Yes [provider]  benzonatate (TESSALON) 200 MG capsule Take 1 capsule (200 mg total) by mouth 3 (three) times daily as needed for  cough. 02/03/21   Wynona Luna, MD  HYDROcodone-acetaminophen (NORCO/VICODIN) 5-325 MG tablet Take 1 tablet by mouth every 6 (six) hours as needed. 08/25/20   Margarette Canada, NP  meloxicam (MOBIC) 15 MG tablet Take 15 mg by mouth daily.    [provider]  predniSONE (DELTASONE) 50 MG tablet Take 1 tablet (50 mg total) by mouth daily. 02/03/21   Wynona Luna, MD  citalopram (CELEXA) 40 MG tablet Take 40 mg by mouth daily.  09/26/18  [provider]    Family History Family History  Problem Relation Age of Onset   Hypertension Mother    Hyperlipidemia Mother    Epilepsy Father     Social History Social History   Tobacco Use   Smoking status: Every Day    Packs/day: 0.50    Types: Cigarettes   Smokeless tobacco: Never  Vaping Use    Vaping Use: Never used  Substance Use Topics   Alcohol use: Yes   Drug use: No     Allergies   Tegretol [carbamazepine], Doxycycline, and Sulfa antibiotics   Review of Systems Review of Systems :negative unless otherwise stated in HPI.      Physical Exam Triage Vital Signs ED Triage Vitals [04/21/22 1818]  Enc Vitals Group     BP      Pulse      Resp      Temp      Temp src      SpO2      Weight      Height      Head Circumference      Peak Flow      Pain Score 3     Pain Loc      Pain Edu?      Excl. in [place]?    No data found.  Updated Vital Signs BP 128/80 (BP Location: Right Arm)   Pulse 83   Temp 99 F (37.2 C) (Oral)   Resp 16   LMP 06/04/2015   SpO2 98%   Visual Acuity Right Eye Distance:   Left Eye Distance:   Bilateral Distance:    Right Eye Near:   Left Eye Near:    Bilateral Near:     Physical Exam  GEN: pleasant well appearing female, in no acute distress CV: regular rate and rhythm, no murmurs appreciated  RESP: no increased work of breathing, clear to ascultation bilaterally ABD: Bowel sounds present. Soft, generalized tenderness, non-distended.  No guarding, no rebound, no appreciable hepatosplenomegaly, no CVA tenderness, negative McBurney's, negative Murphy MSK: no extremity edema SKIN: warm, dry, no rash on visible skin NEURO: alert, moves all extremities appropriately PSYCH: Normal affect, appropriate speech and behavior   UC Treatments / Results  Labs (all labs ordered are listed, but only abnormal results are displayed) Labs Reviewed  URINE CULTURE - Abnormal; Notable for the following components:      Result Value   Culture   (*)    Value: <10,000 COLONIES/mL INSIGNIFICANT GROWTH Performed at [facility], [address] [place], [place] [address]    All other components within normal limits  URINALYSIS, W/ REFLEX TO CULTURE (INFECTION SUSPECTED) - Abnormal; Notable for the following components:   Color, Urine  AMBER (*)    APPearance HAZY (*)    Bilirubin Urine SMALL (*)    Ketones, ur 15 (*)    Protein, ur 30 (*)    Leukocytes,Ua TRACE (*)    Bacteria, UA FEW (*)  All other components within normal limits  COMPREHENSIVE METABOLIC PANEL - Abnormal; Notable for the following components:   Glucose, Bld 100 (*)    Calcium 8.8 (*)    All other components within normal limits  RESP PANEL BY RT-PCR (RSV, FLU A&B, COVID)  RVPGX2  CBC WITH DIFFERENTIAL/PLATELET  LIPASE, BLOOD  VITAMIN B12    EKG   Radiology No results found.  Procedures Procedures (including critical care time)  Medications Ordered in UC Medications  lidocaine (XYLOCAINE) 2 % viscous mouth solution 15 mL (15 mLs Mouth/Throat Given 04/21/22 1931)  alum & mag hydroxide-simeth (MAALOX/MYLANTA) 200-200-20 MG/5ML suspension 30 mL (30 mLs Oral Given 04/21/22 1931)    Initial Impression / Assessment and Plan / UC Course  I have reviewed the triage vital signs and the nursing notes.  Pertinent labs & imaging results that were available during my care of the patient were reviewed by me and considered in my medical decision making (see chart for details).       Patient is a  52 y.o. female who presents after having insidious generalized abdominal pain.  Overall, patient is well-appearing, well-hydrated, and in no acute distress.  Vital signs stable.  Angelais afebrile.  Exam is not concerning for an acute abdomen.  Obtained UA,, CBC, CMP,  and lipase.  Her COVID, RSV and influenza were negative.  She has not gotten significant improvement with the GI cocktail.  Her urinalysis with possible acute cystitis.  She was given antibiotics to cover for this.  Urine culture obtained and patient to continue or change antibiotics as appropriate.  CBC and lipase were normal.  CMP grossly unremarkable.  Etiology of patient's abdominal pain is unclear at this time.  She may have viral gastroenteritis given that several of her coworkers have also  had similar symptoms.  Zofran for nausea.  Reports burning sensation in abdomen after eating. Pepcid and omeprazole prescribed for GERD.  Pt this left sided facial pain. Has history of nerve problems, per patient. On chart review, previous Vit B12 401 which can be deemed as low end of normal.  Repeat B12 today to see if her symptoms are due to deficiency.  Given cefdinir to cover for both a urinary tract infection as well as possible sinusitis.    Follow-up, return and ED precautions given. Discussed MDM, treatment plan and plan for follow-up with patient who agrees with plan.    Final Clinical Impressions(s) / UC Diagnoses   Final diagnoses:  Generalized abdominal pain  Dyspepsia     Discharge Instructions      Your COVID, influenza and RSV tests are all negative. Your blood work did not show cause of your symptoms. Your urine culture and Vitamin B12 results will come back in the next 2-3 days. We will contact your if you need to change antibiotics.    For your burning sensation: you may have acid reflux.  Take omeprazole once daily and pepcid twice daily to help relieve your pain. Follow up with your primary care provider to discuss if you need to be tested for H.pylori bacteria.    Zofran was sent to the pharmacy to help with nausea.      ED Prescriptions     Medication Sig Dispense Auth. Provider   omeprazole (PRILOSEC) 40 MG capsule Take 1 capsule (40 mg total) by mouth daily. 30 capsule Kasiah Manka, DO   famotidine (PEPCID) 20 MG tablet Take 1 tablet (20 mg total) by mouth 2 (two) times daily. [place]  tablet Mayra Brahm, DO   ondansetron (ZOFRAN-ODT) 4 MG disintegrating tablet Take 1 tablet (4 mg total) by mouth every 8 (eight) hours as needed. 20 tablet Migel Hannis, DO   cefdinir (OMNICEF) 300 MG capsule Take 1 capsule (300 mg total) by mouth 2 (two) times daily for 7 days. 14 capsule Ananya Mccleese, DO      PDMP not reviewed this encounter.   Lyndee Hensen,  DO 04/24/22 1629

## 2022-04-22 LAB — URINE CULTURE: Culture: <10000 — AB

## 2022-06-01 ENCOUNTER — Emergency Department: Payer: BC Managed Care – PPO

## 2022-06-01 ENCOUNTER — Ambulatory Visit
Admission: RE | Admit: 2022-06-01 | Discharge: 2022-06-01 | Disposition: A | Discharge status: Home / Self Care | Payer: BC Managed Care – PPO | Source: Ambulatory Visit | Attending: Internal Medicine | Admitting: Internal Medicine

## 2022-06-01 ENCOUNTER — Other Ambulatory Visit: Payer: Self-pay

## 2022-06-01 ENCOUNTER — Emergency Department
Admission: EM | Admit: 2022-06-01 | Discharge: 2022-06-01 | Discharge status: Against Medical Advice | Payer: BC Managed Care – PPO | Attending: Family Medicine | Admitting: Family Medicine

## 2022-06-01 ENCOUNTER — Ambulatory Visit (INDEPENDENT_AMBULATORY_CARE_PROVIDER_SITE_OTHER): Payer: BC Managed Care – PPO

## 2022-06-01 ENCOUNTER — Ambulatory Visit: Payer: BC Managed Care – PPO

## 2022-06-01 VITALS — BP 123/85 | HR 81 | Temp 99.0°F | Resp 17

## 2022-06-01 DIAGNOSIS — M25512 Pain in left shoulder: Secondary | ICD-10-CM | POA: Diagnosis not present

## 2022-06-01 DIAGNOSIS — Z5321 Procedure and treatment not carried out due to patient leaving prior to being seen by health care provider: Secondary | ICD-10-CM | POA: Insufficient documentation

## 2022-06-01 DIAGNOSIS — M542 Cervicalgia: Secondary | ICD-10-CM | POA: Insufficient documentation

## 2022-06-01 DIAGNOSIS — S46912A Strain of unspecified muscle, fascia and tendon at shoulder and upper arm level, left arm, initial encounter: Secondary | ICD-10-CM

## 2022-06-01 MED ORDER — CYCLOBENZAPRINE HCL 10 MG PO TABS: 10.0000 mg | ORAL_TABLET | Freq: Three times a day (TID) | ORAL | 0 refills | Status: AC

## 2022-06-01 NOTE — ED Notes (Signed)
Applied C-Collar  and Shoulder immobilizer on patient and gave instructions for patient to follow up at ER for CT to be completed.
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
## 2022-06-01 NOTE — ED Notes (Addendum)
Called [employer] admin of [place] to initiate [place] for CT cervical spine w/o contrast.   S/w intake rep & they stated CT would be considered OON due to not being in Network w/us. Informed pt & asked to see if ok to proceed w/CT. Pt declined at this time due to not wanting to receive large bill. Nettie Elm PA notified & verbalized understanding.

## 2022-06-01 NOTE — Discharge Instructions (Addendum)
You may go to the ER at a facility your insurance will cover to have the neck CT done. Since you are having bone pain on this area, it is recommended to have this done for best view of the bones.   Follow up with your primary care doctor in 3- 4 days for your shoulder
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
## 2023-05-15 IMAGING — CR DG KNEE COMPLETE 4+V*L*
4 series · 4 of 4 positions shown · non-contrast
Comparison: None.

CLINICAL DATA: Recent fall with left knee pain and anterior
swelling, initial encounter

EXAM:
LEFT KNEE - COMPLETE 4+ VIEW

[knee ap]
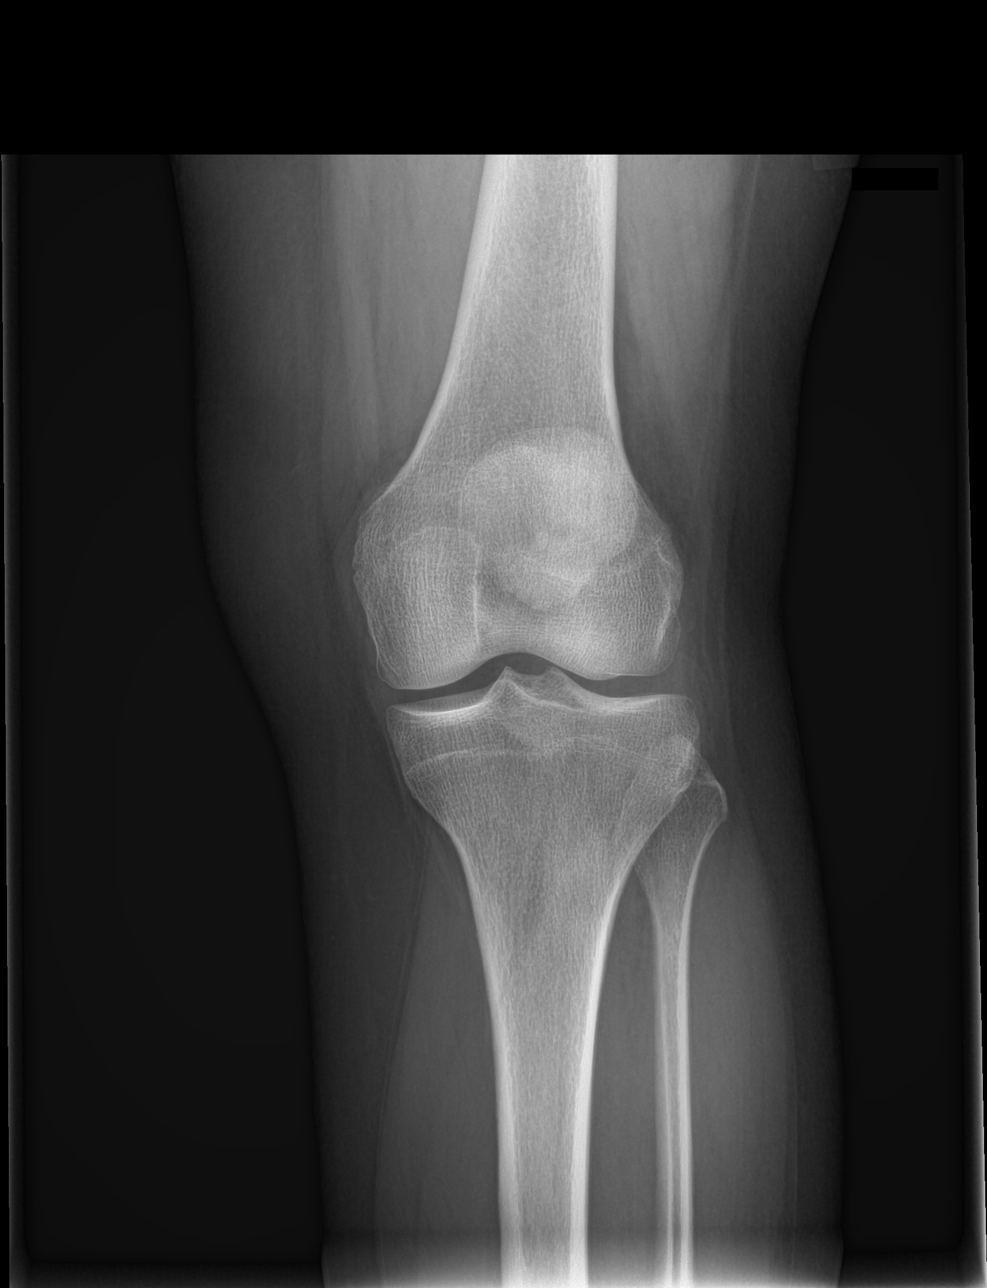

[knee lat]
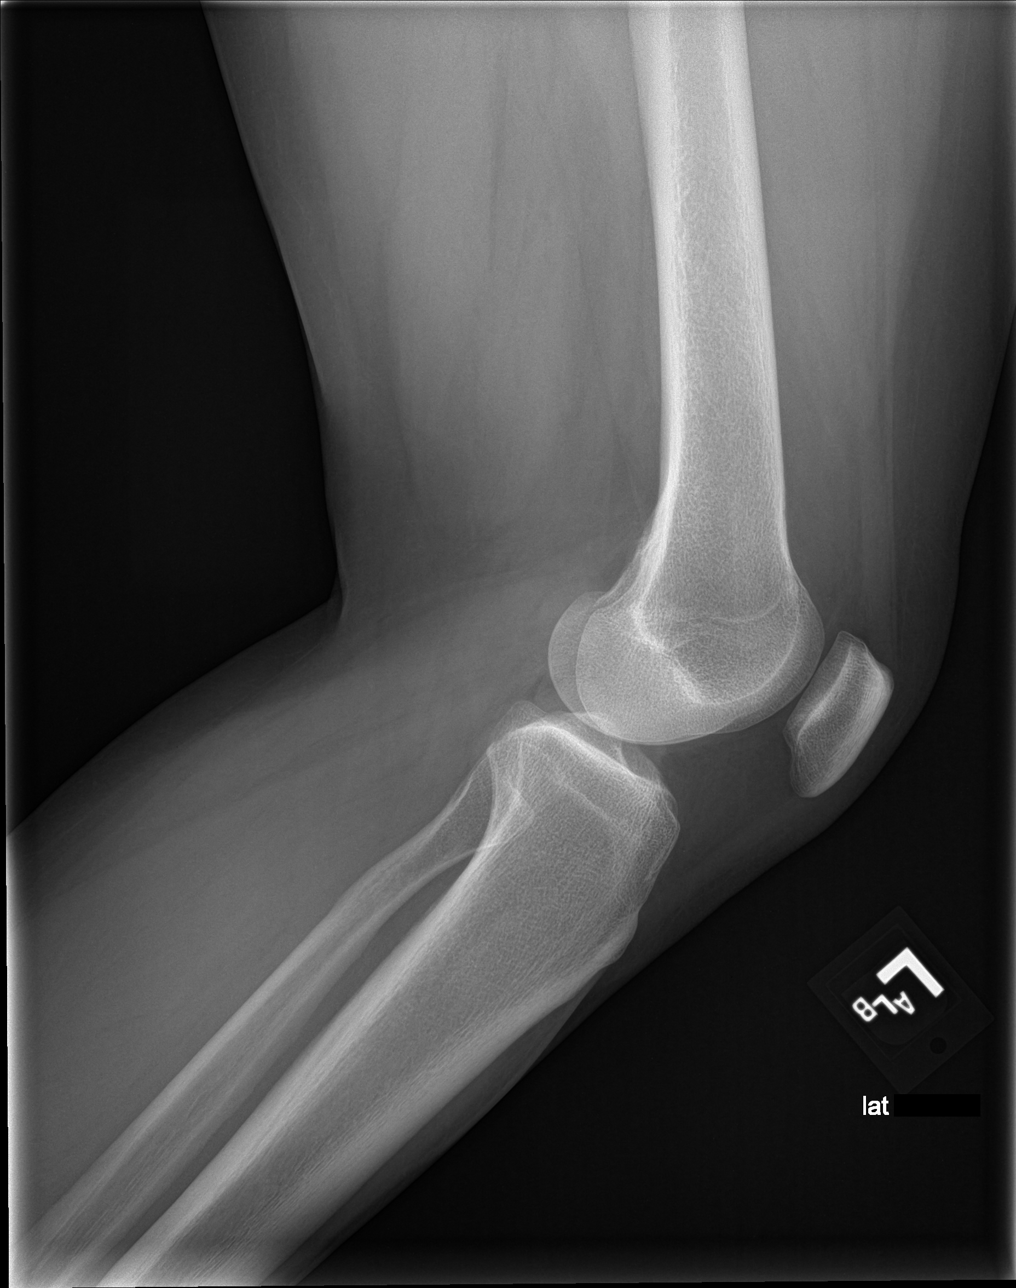

[tunnel]
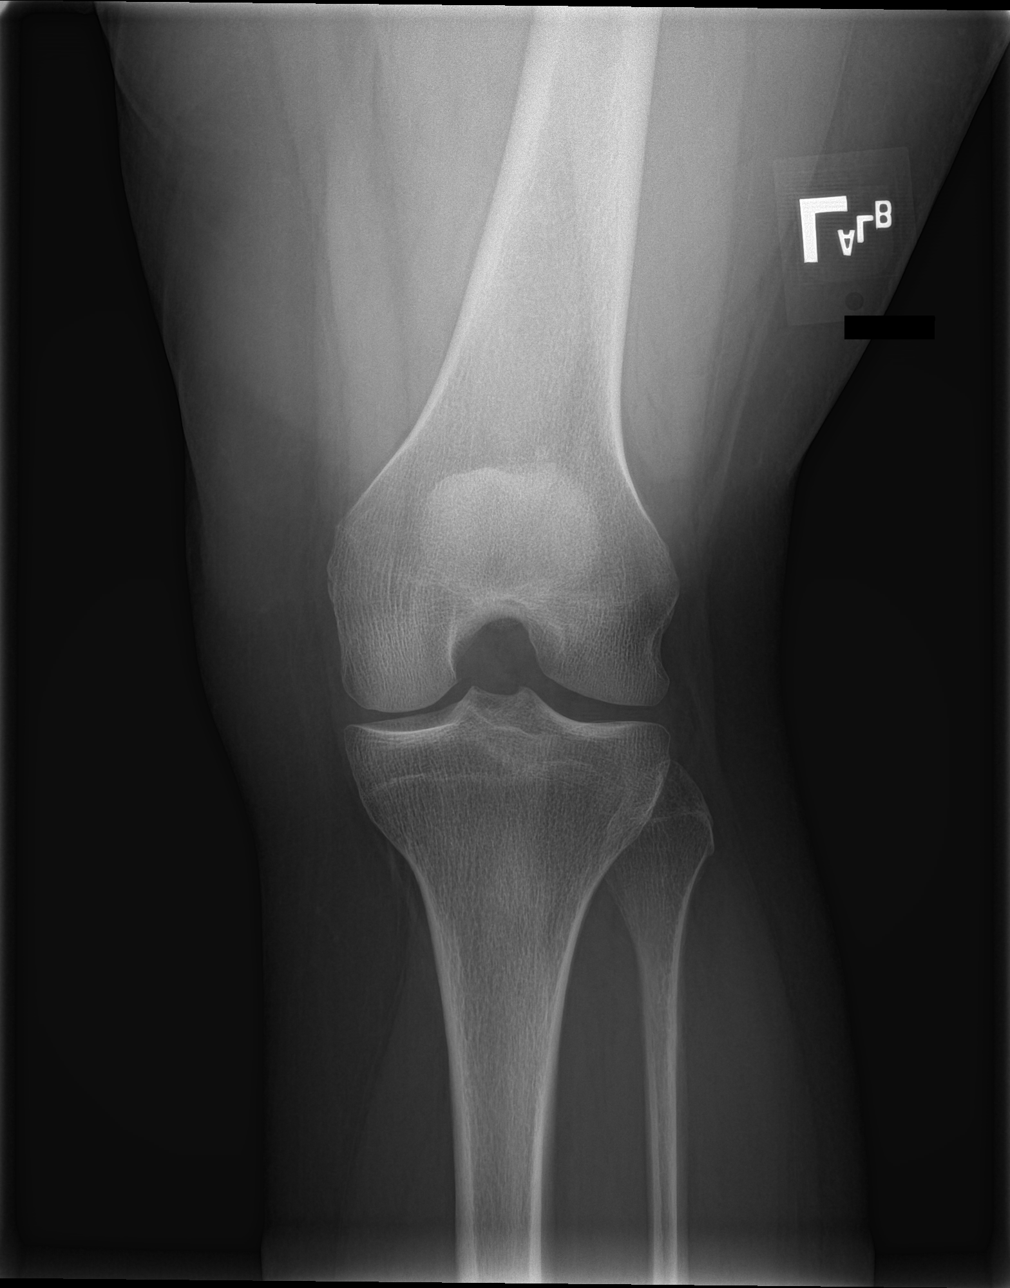

[patella skyline]
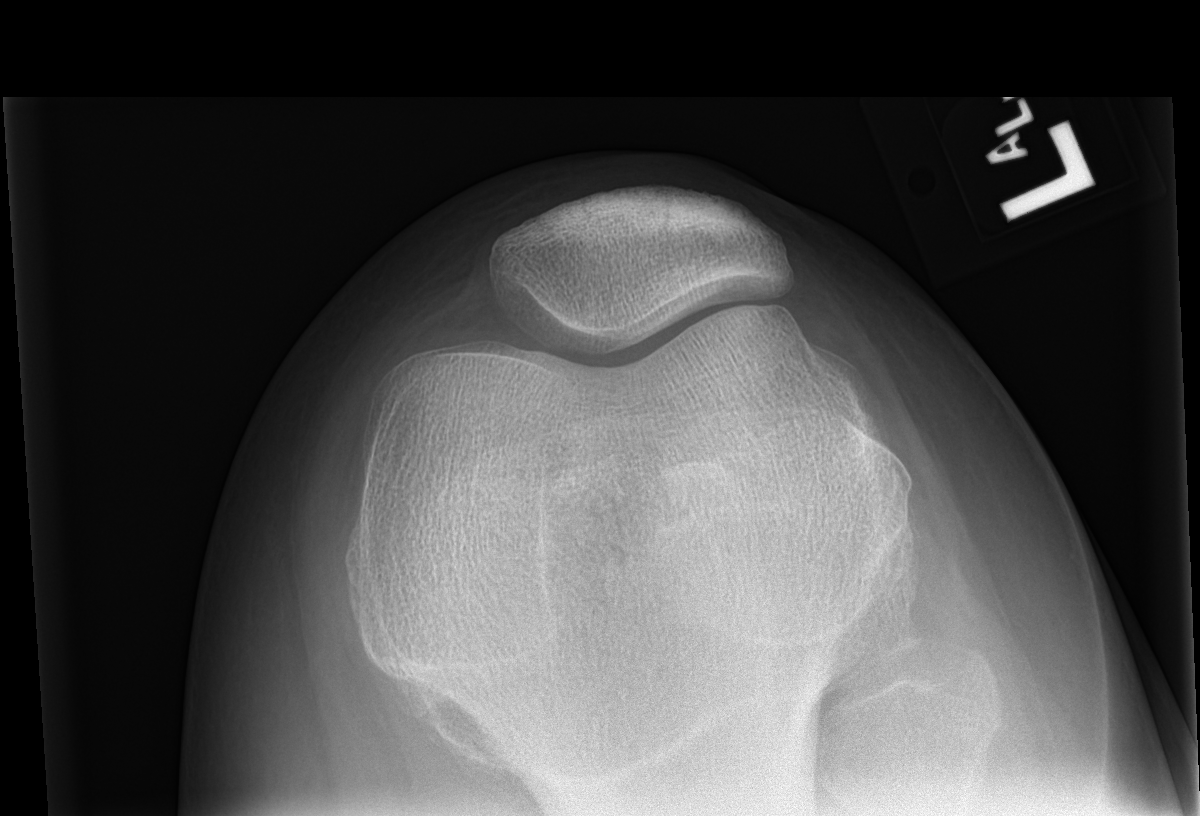

[4 of 4 positions shown; findings below may reference images not displayed]

FINDINGS: No evidence of fracture, dislocation, or joint effusion. No evidence
of arthropathy or other focal bone abnormality. Soft tissues are
unremarkable.
IMPRESSION: No acute abnormality noted.
